# Patient Record
Sex: Male | Born: 1950 | Race: White | Hispanic: No | Smoking: Former smoker
Health system: Southern US, Community
[De-identification: ages and names within clinical notes are randomized; demographics above are authoritative.]

## PROBLEM LIST (undated history)

## (undated) DIAGNOSIS — M51369 Other intervertebral disc degeneration, lumbar region without mention of lumbar back pain or lower extremity pain: Secondary | ICD-10-CM

## (undated) DIAGNOSIS — M5030 Other cervical disc degeneration, unspecified cervical region: Secondary | ICD-10-CM

## (undated) DIAGNOSIS — IMO0001 Reserved for inherently not codable concepts without codable children: Secondary | ICD-10-CM

## (undated) DIAGNOSIS — M5136 Other intervertebral disc degeneration, lumbar region: Secondary | ICD-10-CM

## (undated) DIAGNOSIS — R51 Headache: Secondary | ICD-10-CM

## (undated) DIAGNOSIS — H548 Legal blindness, as defined in USA: Secondary | ICD-10-CM

## (undated) DIAGNOSIS — I739 Peripheral vascular disease, unspecified: Secondary | ICD-10-CM

## (undated) DIAGNOSIS — T8859XA Other complications of anesthesia, initial encounter: Secondary | ICD-10-CM

## (undated) DIAGNOSIS — G40909 Epilepsy, unspecified, not intractable, without status epilepticus: Secondary | ICD-10-CM

## (undated) DIAGNOSIS — R351 Nocturia: Secondary | ICD-10-CM

## (undated) DIAGNOSIS — R519 Headache, unspecified: Secondary | ICD-10-CM

## (undated) DIAGNOSIS — E119 Type 2 diabetes mellitus without complications: Secondary | ICD-10-CM

## (undated) DIAGNOSIS — R569 Unspecified convulsions: Secondary | ICD-10-CM

## (undated) DIAGNOSIS — I6529 Occlusion and stenosis of unspecified carotid artery: Secondary | ICD-10-CM

## (undated) DIAGNOSIS — R3915 Urgency of urination: Secondary | ICD-10-CM

## (undated) DIAGNOSIS — R531 Weakness: Secondary | ICD-10-CM

## (undated) DIAGNOSIS — I639 Cerebral infarction, unspecified: Secondary | ICD-10-CM

## (undated) DIAGNOSIS — T4145XA Adverse effect of unspecified anesthetic, initial encounter: Secondary | ICD-10-CM

## (undated) DIAGNOSIS — I10 Essential (primary) hypertension: Secondary | ICD-10-CM

## (undated) DIAGNOSIS — H409 Unspecified glaucoma: Secondary | ICD-10-CM

## (undated) DIAGNOSIS — K219 Gastro-esophageal reflux disease without esophagitis: Secondary | ICD-10-CM

## (undated) DIAGNOSIS — H269 Unspecified cataract: Secondary | ICD-10-CM

## (undated) HISTORY — DX: Occlusion and stenosis of unspecified carotid artery: I65.29

## (undated) HISTORY — PX: COLONOSCOPY: SHX174

## (undated) HISTORY — DX: Epilepsy, unspecified, not intractable, without status epilepticus: G40.909

## (undated) HISTORY — PX: EYE SURGERY: SHX253

---

## 2001-03-03 ENCOUNTER — Ambulatory Visit (HOSPITAL_COMMUNITY): Admission: RE | Admit: 2001-03-03 | Discharge: 2001-03-03 | Payer: Self-pay | Admitting: Ophthalmology

## 2001-05-19 ENCOUNTER — Ambulatory Visit (HOSPITAL_COMMUNITY): Admission: RE | Admit: 2001-05-19 | Discharge: 2001-05-19 | Payer: Self-pay | Admitting: Ophthalmology

## 2002-04-12 ENCOUNTER — Emergency Department (HOSPITAL_COMMUNITY): Admission: EM | Admit: 2002-04-12 | Discharge: 2002-04-12 | Payer: Self-pay | Admitting: Emergency Medicine

## 2003-01-18 ENCOUNTER — Encounter: Payer: Self-pay | Admitting: *Deleted

## 2003-01-18 ENCOUNTER — Emergency Department (HOSPITAL_COMMUNITY): Admission: EM | Admit: 2003-01-18 | Discharge: 2003-01-18 | Payer: Self-pay | Admitting: *Deleted

## 2006-08-08 ENCOUNTER — Emergency Department (HOSPITAL_COMMUNITY): Admission: EM | Admit: 2006-08-08 | Discharge: 2006-08-08 | Payer: Self-pay | Admitting: Emergency Medicine

## 2006-11-02 ENCOUNTER — Emergency Department (HOSPITAL_COMMUNITY): Admission: EM | Admit: 2006-11-02 | Discharge: 2006-11-02 | Payer: Self-pay | Admitting: Emergency Medicine

## 2007-08-22 ENCOUNTER — Emergency Department (HOSPITAL_COMMUNITY): Admission: EM | Admit: 2007-08-22 | Discharge: 2007-08-22 | Payer: Self-pay | Admitting: Emergency Medicine

## 2007-12-24 ENCOUNTER — Emergency Department (HOSPITAL_COMMUNITY): Admission: EM | Admit: 2007-12-24 | Discharge: 2007-12-24 | Payer: Self-pay | Admitting: Emergency Medicine

## 2007-12-27 ENCOUNTER — Emergency Department (HOSPITAL_COMMUNITY): Admission: EM | Admit: 2007-12-27 | Discharge: 2007-12-27 | Payer: Self-pay | Admitting: Emergency Medicine

## 2008-01-28 ENCOUNTER — Emergency Department (HOSPITAL_COMMUNITY): Admission: EM | Admit: 2008-01-28 | Discharge: 2008-01-28 | Payer: Self-pay | Admitting: Emergency Medicine

## 2011-09-03 ENCOUNTER — Encounter (HOSPITAL_COMMUNITY): Admission: RE | Admit: 2011-09-03 | Discharge: 2011-09-03 | Payer: Medicare Other | Source: Ambulatory Visit

## 2011-09-03 NOTE — Patient Instructions (Signed)
[address]  09/03/2011   Your procedure is scheduled on:  09/10/11  Report to [facility] at 06:15 AM.  Call this number if you have problems the morning of surgery: [phone]   Remember:   Do not eat food:After Midnight.  May have clear liquids:until Midnight .  Clear liquids include soda, tea, black coffee, apple or grape juice, broth.  Take these medicines the morning of surgery with A SIP OF WATER:    Do not wear jewelry, make-up or nail polish.  Do not wear lotions, powders, or perfumes. You may wear deodorant.  Do not shave 48 hours prior to surgery.  Do not bring valuables to the hospital.  Contacts, dentures or bridgework may not be worn into surgery.  Leave suitcase in the car. After surgery it may be brought to your room.  For patients admitted to the hospital, checkout time is 11:00 AM the day of discharge.   Patients discharged the day of surgery will not be allowed to drive home.  Name and phone number of your driver:   Special Instructions: N/A   Please read over the following fact sheets that you were given: Anesthesia Post-op Instructions and Care and Recovery After Surgery    Cataract A cataract is a clouding of the lens of the eye. It is most often related to aging. A cataract is not a "film" over the surface of the eye. The lens is inside the eye and changes size of the pupil. The lens can enlarge to let more light enter the eye in dark environments and contract the size of the pupil to let in bright light. The lens is the part of the eye that helps focus light on the retina. The retina is the eye's light-sensitive layer. It is in the back of the eye that sends visual signals to the brain. In a normal eye, light passes through the lens and gets focused on the retina. To help produce a sharp image, the lens must remain clear. When a lens becomes cloudy, vision is compromised by the degree and nature of the clouding. Certain cataracts make people more near-sighted as  they develop, others increase glare, and all reduce vision to some degree or another. A cataract that is so dense that it becomes milky Jamoni Hewes and a Chenay Nesmith opacity can be seen through the pupil. When the Kobi Aller color is seen, it is called a "mature" or "hyper-mature cataract." Such cataracts cause total blindness in the affected eye. The cataract must be removed to prevent damage to the eye itself. Some types of cataracts can cause a secondary disease of the eye, such as certain types of glaucoma. In the early stages, better lighting and eyeglasses may lessen vision problems caused by cataracts. At a certain point, surgery may be needed to improve vision. CAUSES   Aging. However, cataracts may occur at any age, even in newborns.   Certain drugs.   Trauma to the eye.   Certain diseases (such as diabetes).   Inherited or acquired medical syndromes.  SYMPTOMS   Gradual, progressive drop in vision in the affected eye. Cataracts may develop at different rates in each eye. Cataracts may even be in just one eye with the other unaffected.   Cataracts due to trauma may develop quickly, sometimes over a matter or days or even hours. The result is severe and rapid visual loss.  DIAGNOSIS  To detect a cataract, an eye doctor examines the lens. A well developed cataract can be  diagnosed without dilating the pupil. Early cataracts and others of a specific nature are best diagnosed with an exam of the eyes with the pupils dilated by drops. TREATMENT   For an early cataract, vision may improve by using different eyeglasses or stronger lighting.   If the above measures do not help, surgery is the only effective treatment. This treatment removes the cloudy lens and replaces it with a substitute lens (Intraocular lens, or IOL). Newly developed IOL technology allows the implanted lens to improve vision both at a distance and up close. Discuss with your eye surgeon about the possibility of still needing glasses.  Also discuss how visual coordination between both eyes will be affected.  A cataract needs to be removed only when vision loss interferes with your everyday activities such as driving, reading or watching TV. You and your eye doctor can make that decision together. In most cases, waiting until you are ready to have cataract surgery will not harm your eye. If you have cataracts in both eyes, only one should be removed at a time. This allows the operated eye to heal and be out of danger from serious problems (such as infection or poor wound healing) before having the other eye undergo surgery.  Sometimes, a cataract should be removed even if it does not cause problems with your vision. For example, a cataract should be removed if it prevents examination or treatment of another eye problem. Just as you cannot see out of the affected eye well, your doctor cannot see into your eye well through a cataract. The vast majority of people who have cataract surgery have better vision afterward. CATARACT REMOVAL There are two primary ways to remove a cataract. Your doctor can explain the differences and help determine which is best for you:  Phacoemulsification (small incision cataract surgery). This involves making a small cut (incision) on the edge of the clear, dome-shaped surface that covers the front of the eye (the cornea). An injection behind the eye or eye drops are given to make this a painless procedure. The doctor then inserts a tiny probe into the eye. This device emits ultrasound waves that soften and break up the cloudy center of the lens so it can be removed by suction. Most cataract surgery is done this way. The cuts are usually so small and performed in such a manner that often no sutures are needed to keep it closed.   Extracapsular surgery. Your doctor makes a slightly longer incision on the side of the cornea. The doctor removes the hard center of the lens. The remainder of the lens is then removed  by suction. In some cases, extremely fine sutures are needed which the doctor may, or may not remove in the office after the surgery.  When an IOL is implanted, it needs no care. It becomes a permanent part of your eye and cannot be seen or felt.  Some people cannot have an IOL. They may have problems during surgery, or maybe they have another eye disease. For these people, a soft contact lens may be suggested. If an IOL or contact lens cannot be used, very powerful and thick glasses are required after surgery. Since vision is very different through such thick glasses, it is important to have your doctor discuss the impact on your vision after any cataract surgery where there is no plan to implant an IOL. The normal lens of the eye is covered by a clear capsule. Both phacoemulsification and extracapsular surgery require that  the back surface of this lens capsule be left in place. This helps support IOLs and prevents the IOL from dislocating and falling back into the deeper interior of the eye. Right after surgery, and often permanently this "posterior capsule" remains clear. In some cases however, it can become cloudy, presenting the same type of visual compromise that the original cataract did since light is again obstructed as it passes through the clear IOL. This condition is often referred to as an "after-cataract." Fortunately, after-cataracts are easily treated using a painless and very fast laser treatment that is performed without anesthesia or incisions. It is done in a matter of minutes in an outpatient environment. Visual improvement is often immediate.  HOME CARE INSTRUCTIONS   Your surgeon will discuss pre and post operative care with you prior to surgery. The majority of people are able to do almost all normal activities right away. Although, it is often advised to avoid strenuous activity for a period of time.   Postoperative drops and careful avoidance of infection will be needed. Many  surgeons suggest the use of a protective shield during the first few days after surgery.   There is a very small incidence of complication from modern cataract surgery, but it can happen. Infection that spreads to the inside of the eye (endophthalmitis) can result in total visual loss and even loss of the eye itself. In extremely rare instances, the inflammation of endophthalmitis can spread to both eyes (sympathetic ophthalmia). Appropriate post-operative care under the close observation of your surgeon is essential to a successful outcome.  SEEK IMMEDIATE MEDICAL CARE IF:   You have any sudden drop of vision in the operated eye.   You have pain in the operated eye.   You see a large number of floating dots in the field of vision in the operated eye.   You see flashing lights, or if a portion of your side vision in any direction appears black (like a curtain being drawn into your field of vision) in the operated eye.  Document Released: 07/01/2005 Document Revised: 03/13/2011 Document Reviewed: 08/17/2007 [facility] Patient Information 2012 [place], [place].   PATIENT INSTRUCTIONS POST-ANESTHESIA  IMMEDIATELY FOLLOWING SURGERY:  Do not drive or operate machinery for the first twenty four hours after surgery.  Do not make any important decisions for twenty four hours after surgery or while taking narcotic pain medications or sedatives.  If you develop intractable nausea and vomiting or a severe headache please notify your doctor immediately.  FOLLOW-UP:  Please make an appointment with your surgeon as instructed. You do not need to follow up with anesthesia unless specifically instructed to do so.  WOUND CARE INSTRUCTIONS (if applicable):  Keep a dry clean dressing on the anesthesia/puncture wound site if there is drainage.  Once the wound has quit draining you may leave it open to air.  Generally you should leave the bandage intact for twenty four hours unless there is drainage.  If the epidural  site drains for more than 36-48 hours please call the anesthesia department.  QUESTIONS?:  Please feel free to call your physician or the hospital operator if you have any questions, and they will be happy to assist you.     [facility] Anesthesia Department [address] [place] [phone]

## 2011-09-10 ENCOUNTER — Ambulatory Visit (HOSPITAL_COMMUNITY): Admission: RE | Admit: 2011-09-10 | Payer: Medicare Other | Source: Ambulatory Visit | Admitting: Ophthalmology

## 2011-09-10 ENCOUNTER — Encounter (HOSPITAL_COMMUNITY): Admission: RE | Payer: Self-pay | Source: Ambulatory Visit

## 2011-09-10 SURGERY — PHACOEMULSIFICATION, CATARACT, WITH IOL INSERTION
Anesthesia: Monitor Anesthesia Care | Laterality: Right

## 2014-08-17 ENCOUNTER — Emergency Department (HOSPITAL_COMMUNITY): Payer: Medicare Other

## 2014-08-17 ENCOUNTER — Encounter (HOSPITAL_COMMUNITY): Payer: Self-pay | Admitting: Emergency Medicine

## 2014-08-17 ENCOUNTER — Emergency Department (HOSPITAL_COMMUNITY)
Admission: EM | Admit: 2014-08-17 | Discharge: 2014-08-18 | Disposition: A | Discharge status: Home / Self Care | Payer: Medicare Other | Attending: Emergency Medicine | Admitting: Emergency Medicine

## 2014-08-17 DIAGNOSIS — H409 Unspecified glaucoma: Secondary | ICD-10-CM | POA: Diagnosis not present

## 2014-08-17 DIAGNOSIS — Y92009 Unspecified place in unspecified non-institutional (private) residence as the place of occurrence of the external cause: Secondary | ICD-10-CM | POA: Insufficient documentation

## 2014-08-17 DIAGNOSIS — Z720 Tobacco use: Secondary | ICD-10-CM | POA: Diagnosis not present

## 2014-08-17 DIAGNOSIS — Z8673 Personal history of transient ischemic attack (TIA), and cerebral infarction without residual deficits: Secondary | ICD-10-CM | POA: Insufficient documentation

## 2014-08-17 DIAGNOSIS — Y288XXA Contact with other sharp object, undetermined intent, initial encounter: Secondary | ICD-10-CM | POA: Diagnosis not present

## 2014-08-17 DIAGNOSIS — Z7982 Long term (current) use of aspirin: Secondary | ICD-10-CM | POA: Insufficient documentation

## 2014-08-17 DIAGNOSIS — Z23 Encounter for immunization: Secondary | ICD-10-CM | POA: Insufficient documentation

## 2014-08-17 DIAGNOSIS — Y9389 Activity, other specified: Secondary | ICD-10-CM | POA: Diagnosis not present

## 2014-08-17 DIAGNOSIS — S61215A Laceration without foreign body of left ring finger without damage to nail, initial encounter: Secondary | ICD-10-CM | POA: Insufficient documentation

## 2014-08-17 DIAGNOSIS — R569 Unspecified convulsions: Secondary | ICD-10-CM | POA: Insufficient documentation

## 2014-08-17 DIAGNOSIS — E119 Type 2 diabetes mellitus without complications: Secondary | ICD-10-CM | POA: Insufficient documentation

## 2014-08-17 DIAGNOSIS — I10 Essential (primary) hypertension: Secondary | ICD-10-CM | POA: Insufficient documentation

## 2014-08-17 DIAGNOSIS — R51 Headache: Secondary | ICD-10-CM | POA: Insufficient documentation

## 2014-08-17 DIAGNOSIS — Y998 Other external cause status: Secondary | ICD-10-CM | POA: Insufficient documentation

## 2014-08-17 HISTORY — DX: Type 2 diabetes mellitus without complications: E11.9

## 2014-08-17 HISTORY — DX: Unspecified convulsions: R56.9

## 2014-08-17 HISTORY — DX: Essential (primary) hypertension: I10

## 2014-08-17 MED ORDER — TETANUS-DIPHTH-ACELL PERTUSSIS 5-2.5-18.5 LF-MCG/0.5 IM SUSP
0.5000 mL | Freq: Once | INTRAMUSCULAR | Status: AC
  Administered 2014-08-17: 0.5 mL via INTRAMUSCULAR
  Filled 2014-08-17: qty 0.5

## 2014-08-17 MED ORDER — LEVETIRACETAM 500 MG PO TABS
500.0000 mg | ORAL_TABLET | Freq: Once | ORAL | Status: AC
  Administered 2014-08-17: 500 mg via ORAL
  Filled 2014-08-17: qty 1

## 2014-08-17 MED ORDER — LIDOCAINE HCL (PF) 1 % IJ SOLN
INTRAMUSCULAR | Status: AC
  Administered 2014-08-17: 23:00:00
  Filled 2014-08-17: qty 5

## 2014-08-17 NOTE — ED Provider Notes (Signed)
CSN: [id number]     Arrival date & time 08/17/14  2054 History  This chart was scribed for Gerhard Munchobert Yosselin Zoeller, MD by Tonye RoyaltyJoshua Chen, ED Scribe. This patient was seen in room APA19/APA19 and the patient's care was started at 9:26 PM.    Chief Complaint  Patient presents with  . Seizures   The history is provided by the patient and a relative. No language interpreter was used.    HPI Comments: Ricky Sherman is a 64 y.o. male with history of seizures for the past 2.5 years who presents to the Emergency Department complaining of seizure just PTA, witnessed by his daughter. He states his PCP decided not to prescribe him any preventative medications. Per daughter, the seizures are not always the same. She states today his head was shaking with his seizure, lasting a few seconds. He states he gets clammy before his seizures and states he had that today. He reports a post ictal phase, daughter notes it was longer than usual today. He states he feels better now except a headache, which he states he typically has after seizures. He denies tongue biting. He denies falling or striking his head. He notes a laceration to his left 4th finger that occurred earlier today, he requests tetanus shot. He reports history of DM, HTN, glaucoma, CVA, and degenerative disc disease. He states he is out of his HTN medication and has been having difficulty finding a PCP who takes Medicare/Medicaid.  Past Medical History  Diagnosis Date  . Diabetes mellitus without complication   . Hypertension   . Seizures    Past Surgical History  Procedure Laterality Date  . Eye surgery     No family history on file. History  Substance Use Topics  . Smoking status: Current Every Day Smoker    Types: Cigarettes  . Smokeless tobacco: Not on file  . Alcohol Use: Yes    Review of Systems  Constitutional:       Per HPI, otherwise negative  HENT:       Per HPI, otherwise negative  Respiratory:       Per HPI, otherwise negative   Cardiovascular:       Per HPI, otherwise negative  Gastrointestinal: Negative for vomiting.  Endocrine:       Negative aside from HPI  Genitourinary:       Neg aside from HPI   Musculoskeletal:       Per HPI, otherwise negative  Skin: Positive for wound.  Neurological: Positive for seizures and headaches. Negative for syncope.      Allergies  Review of patient's allergies indicates no known allergies.  Home Medications   Prior to Admission medications   Medication Sig Start Date End Date Taking? Authorizing Provider  aspirin EC 325 MG tablet Take 325 mg by mouth every morning.   Yes Historical Provider, MD  Fish Oil-Cholecalciferol (FISH OIL + D3 PO) Take 1 capsule by mouth 2 (two) times daily.   Yes Historical Provider, MD  UNKNOWN TO PATIENT Take 1 tablet by mouth daily. BP medication-name is unknown   Yes Historical Provider, MD   BP 114/72 mmHg  Pulse 65  Temp(Src) 97.4 F (36.3 C) (Oral)  Resp 16  Ht 5\' 7"  (1.702 m)  Wt 175 lb (79.379 kg)  BMI 27.40 kg/m2  SpO2 93% Physical Exam  Constitutional: He is oriented to person, place, and time. He appears well-developed. No distress.  HENT:  Head: Normocephalic and atraumatic.  Eyes: Conjunctivae and EOM are normal.  Cardiovascular: Normal rate and regular rhythm.   Pulmonary/Chest: Effort normal and breath sounds normal. No stridor. No respiratory distress. He has no wheezes. He has no rales.  Abdominal: He exhibits no distension.  Musculoskeletal: He exhibits no edema.  Neurological: He is alert and oriented to person, place, and time. No cranial nerve deficit. He exhibits normal muscle tone. Coordination normal.  Some atrophy to legs  Skin: Skin is warm and dry.  Psychiatric: He has a normal mood and affect.  Nursing note and vitals reviewed.   ED Course  Procedures (including critical care time)  DIAGNOSTIC STUDIES: Oxygen Saturation is 95% on room air, normal by my interpretation.    COORDINATION OF  CARE: 9:31 PM Discussed treatment plan with patient at beside, the patient agrees with the plan and has no further questions at this time.   Labs Review Labs Reviewed - No data to display  Imaging Review Dg Finger Ring Left  08/17/2014   CLINICAL DATA:  Laceration to the fourth digit left hand. Initial encounter.  EXAM: LEFT RING FINGER 2+V  COMPARISON:  None.  FINDINGS: There is a laceration through the mid fourth digit along the radial surface. No acute fracture, dislocation, or opaque foreign body.  IMPRESSION: Fourth digit laceration without foreign body or fracture.   Electronically Signed   By: Tiburcio Pea M.D.   On: 08/17/2014 22:36   LACERATION REPAIR Performed by: Gerhard Munch Authorized by: Gerhard Munch Consent: Verbal consent obtained. Risks and benefits: risks, benefits and alternatives were discussed Consent given by: patient Patient identity confirmed: provided demographic data Prepped and Draped in normal sterile fashion Wound explored  Laceration Location: L ring finger  Laceration Length: 3.5cm  No Foreign Bodies seen or palpated  Anesthesia: local infiltration  Local anesthetic: lidocaine 1% no epinephrine  Anesthetic total: 5 ml  Irrigation method: syringe Amount of cleaning: standard  Skin closure: 5-0 sutures  Number of sutures: 2  Technique: close  Patient tolerance: Patient tolerated the procedure well with no immediate complications.  11:58 PM Patient and family aware of all results, need follow-up with neurology, the need for medication use pending urology follow-up.   MDM   Patient presents after typical witnessed seizure.  Patient has had seizures for years, no recent medication use. Patient recovered well, had no evidence for distress or other acute new pathology.  Patient had finger laceration repaired without complication.  I personally performed the services described in this documentation, which was scribed in my  presence. The recorded information has been reviewed and is accurate.     Gerhard Munch, MD 08/17/14 [phone]

## 2014-08-17 NOTE — ED Notes (Signed)
Pt had witnessed seizure at home and was incontinent at time.

## 2014-08-17 NOTE — ED Notes (Signed)
Per ems pt has cut and needs tetanus injection.

## 2014-08-17 NOTE — ED Notes (Signed)
Laceration noted to left ring finger. Pt. Reports cutting it on old fridge today. No active bleeding at this time.

## 2014-08-18 MED ORDER — LEVETIRACETAM 500 MG PO TABS: 500.0000 mg | ORAL_TABLET | Freq: Two times a day (BID) | ORAL | Status: AC

## 2014-08-18 NOTE — Discharge Instructions (Signed)
As discussed, it is important that you follow-up with your physicians for appropriate ongoing evaluation of both your laceration in your seizures.  Return here for concerning changes in your condition.    Seizure, Adult A seizure is abnormal electrical activity in the brain. Seizures usually last from 30 seconds to 2 minutes. There are various types of seizures. Before a seizure, you may have a warning sensation (aura) that a seizure is about to occur. An aura may include the following symptoms:   Fear or anxiety.  Nausea.  Feeling like the room is spinning (vertigo).  Vision changes, such as seeing flashing lights or spots. Common symptoms during a seizure include:  A change in attention or behavior (altered mental status).  Convulsions with rhythmic jerking movements.  Drooling.  Rapid eye movements.  Grunting.  Loss of bladder and bowel control.  Bitter taste in the mouth.  Tongue biting. After a seizure, you may feel confused and sleepy. You may also have an injury resulting from convulsions during the seizure. HOME CARE INSTRUCTIONS   If you are given medicines, take them exactly as prescribed by your health care provider.  Keep all follow-up appointments as directed by your health care provider.  Do not swim or drive or engage in risky activity during which a seizure could cause further injury to you or others until your health care provider says it is OK.  Get adequate rest.  Teach friends and family what to do if you have a seizure. They should:  Lay you on the ground to prevent a fall.  Put a cushion under your head.  Loosen any tight clothing around your neck.  Turn you on your side. If vomiting occurs, this helps keep your airway clear.  Stay with you until you recover.  Know whether or not you need emergency care. SEEK IMMEDIATE MEDICAL CARE IF:  The seizure lasts longer than 5 minutes.  The seizure is severe or you do not wake up immediately  after the seizure.  You have an altered mental status after the seizure.  You are having more frequent or worsening seizures. Someone should drive you to the emergency department or call local emergency services (911 in [place] MAKE SURE YOU:  Understand these instructions.  Will watch your condition.  Will get help right away if you are not doing well or get worse. Document Released: 06/28/2000 Document Revised: 04/21/2013 Document Reviewed: 02/10/2013 [facility] Patient Information 2015 [place]. This information is not intended to replace advice given to you by your health care provider. Make sure you discuss any questions you have with your health care provider.

## 2014-12-01 ENCOUNTER — Other Ambulatory Visit (HOSPITAL_COMMUNITY): Payer: Self-pay | Admitting: Family Medicine

## 2014-12-01 DIAGNOSIS — R569 Unspecified convulsions: Secondary | ICD-10-CM

## 2014-12-13 ENCOUNTER — Ambulatory Visit (HOSPITAL_COMMUNITY)
Admission: RE | Admit: 2014-12-13 | Discharge: 2014-12-13 | Disposition: A | Discharge status: Home / Self Care | Payer: Medicare Other | Source: Ambulatory Visit | Attending: Family Medicine | Admitting: Family Medicine

## 2014-12-13 DIAGNOSIS — R531 Weakness: Secondary | ICD-10-CM | POA: Diagnosis not present

## 2014-12-13 DIAGNOSIS — R51 Headache: Secondary | ICD-10-CM | POA: Diagnosis not present

## 2014-12-13 DIAGNOSIS — R569 Unspecified convulsions: Secondary | ICD-10-CM

## 2014-12-29 ENCOUNTER — Encounter (HOSPITAL_COMMUNITY): Payer: Self-pay | Admitting: Emergency Medicine

## 2014-12-29 ENCOUNTER — Emergency Department (HOSPITAL_COMMUNITY)
Admission: EM | Admit: 2014-12-29 | Discharge: 2014-12-29 | Disposition: A | Discharge status: Home / Self Care | Payer: Medicare Other | Attending: Emergency Medicine | Admitting: Emergency Medicine

## 2014-12-29 ENCOUNTER — Emergency Department (HOSPITAL_COMMUNITY): Payer: Medicare Other

## 2014-12-29 DIAGNOSIS — G40909 Epilepsy, unspecified, not intractable, without status epilepticus: Secondary | ICD-10-CM | POA: Insufficient documentation

## 2014-12-29 DIAGNOSIS — I10 Essential (primary) hypertension: Secondary | ICD-10-CM | POA: Insufficient documentation

## 2014-12-29 DIAGNOSIS — Z8673 Personal history of transient ischemic attack (TIA), and cerebral infarction without residual deficits: Secondary | ICD-10-CM | POA: Diagnosis not present

## 2014-12-29 DIAGNOSIS — Z7982 Long term (current) use of aspirin: Secondary | ICD-10-CM | POA: Diagnosis not present

## 2014-12-29 DIAGNOSIS — Z720 Tobacco use: Secondary | ICD-10-CM | POA: Insufficient documentation

## 2014-12-29 DIAGNOSIS — R569 Unspecified convulsions: Secondary | ICD-10-CM

## 2014-12-29 DIAGNOSIS — Z79899 Other long term (current) drug therapy: Secondary | ICD-10-CM | POA: Insufficient documentation

## 2014-12-29 DIAGNOSIS — Z8739 Personal history of other diseases of the musculoskeletal system and connective tissue: Secondary | ICD-10-CM | POA: Insufficient documentation

## 2014-12-29 DIAGNOSIS — E119 Type 2 diabetes mellitus without complications: Secondary | ICD-10-CM | POA: Insufficient documentation

## 2014-12-29 HISTORY — DX: Other cervical disc degeneration, unspecified cervical region: M50.30

## 2014-12-29 HISTORY — DX: Cerebral infarction, unspecified: I63.9

## 2014-12-29 HISTORY — DX: Headache, unspecified: R51.9

## 2014-12-29 HISTORY — DX: Weakness: R53.1

## 2014-12-29 HISTORY — DX: Other intervertebral disc degeneration, lumbar region without mention of lumbar back pain or lower extremity pain: M51.369

## 2014-12-29 HISTORY — DX: Headache: R51

## 2014-12-29 HISTORY — DX: Other intervertebral disc degeneration, lumbar region: M51.36

## 2014-12-29 LAB — I-STAT CHEM 8, ED
BUN: 6 mg/dL (ref 6–20)
CHLORIDE: 103 mmol/L (ref 101–111)
Calcium, Ion: 1.12 mmol/L — ABNORMAL LOW (ref 1.13–1.30)
Creatinine, Ser: 0.8 mg/dL (ref 0.61–1.24)
GLUCOSE: 107 mg/dL — AB (ref 65–99)
HEMATOCRIT: 46 % (ref 39.0–52.0)
HEMOGLOBIN: 15.6 g/dL (ref 13.0–17.0)
POTASSIUM: 4.2 mmol/L (ref 3.5–5.1)
Sodium: 140 mmol/L (ref 135–145)
TCO2: 23 mmol/L (ref 0–100)

## 2014-12-29 MED ORDER — LEVETIRACETAM 500 MG PO TABS
500.0000 mg | ORAL_TABLET | Freq: Once | ORAL | Status: AC
  Administered 2014-12-29: 500 mg via ORAL
  Filled 2014-12-29: qty 1

## 2014-12-29 NOTE — ED Notes (Signed)
Seizure at 1230, Witness seizure by brother.  Denies hitting head.  Denies any pain at this time.  On seizure medication but not sure of the name of medication, did take first dose at 6am and next dose not due until 1600.  Last seizure in January.

## 2014-12-29 NOTE — ED Notes (Signed)
MD at bedside. 

## 2014-12-29 NOTE — ED Notes (Signed)
Pad placed on rails.

## 2014-12-29 NOTE — ED Provider Notes (Signed)
CSN: [id number]     Arrival date & time 12/29/14  1339 History   First MD Initiated Contact with Patient 12/29/14 1356     Chief Complaint  Patient presents with  . Seizures      HPI  Pt was seen at 1405. Per EMS, pt's family and pt, c/o sudden onset and resolution of one episode of "seizure" that occurred PTA. Pt states he "got clammy" and then "woke up on the floor." Pt states he woke up "feeling weak" and "having wet on myself." Pt's family states pt "just shook a little but was mostly stiff" during this episode. Symptoms lasted only a few minutes before spontaneously resolving. Pt states he "just feels tired now." Pt describes all of these symptoms as per his usual seizure pattern for the past 2 to 3 years. Pt's family states they helped pt to the floor, he did not fall. Pt states his PMD is managing his seizure medicine. Denies headache, no neck or back pain, no CP/palpitations, no abd pain, no N/V/D, no new focal motor weakness or tingling/numbness in extremities.       Past Medical History  Diagnosis Date  . Diabetes mellitus without complication   . Hypertension   . Seizures   . DDD (degenerative disc disease), lumbar   . DDD (degenerative disc disease), cervical   . Headache   . Weakness   . Stroke     right sided weakness   Past Surgical History  Procedure Laterality Date  . Eye surgery      History  Substance Use Topics  . Smoking status: Current Every Day Smoker    Types: Cigarettes  . Smokeless tobacco: Not on file  . Alcohol Use: Yes    Review of Systems ROS: Statement: All systems negative except as marked or noted in the HPI; Constitutional: Negative for fever and chills. ; ; Eyes: Negative for eye pain, redness and discharge. ; ; ENMT: Negative for ear pain, hoarseness, nasal congestion, sinus pressure and sore throat. ; ; Cardiovascular: Negative for chest pain, palpitations, diaphoresis, dyspnea and peripheral edema. ; ; Respiratory: Negative for cough,  wheezing and stridor. ; ; Gastrointestinal: Negative for nausea, vomiting, diarrhea, abdominal pain, blood in stool, hematemesis, jaundice and rectal bleeding. . ; ; Genitourinary: Negative for dysuria, flank pain and hematuria. ; ; Musculoskeletal: Negative for back pain and neck pain. Negative for swelling and trauma.; ; Skin: Negative for pruritus, rash, abrasions, blisters, bruising and skin lesion.; ; Neuro: Negative for headache, lightheadedness and neck stiffness. Negative for extremity weakness, paresthesias, +seizure.     Allergies  Review of patient's allergies indicates no known allergies.  Home Medications   Prior to Admission medications   Medication Sig Start Date End Date Taking? Authorizing Provider  aspirin EC 325 MG tablet Take 325 mg by mouth every morning.    Historical Provider, MD  Fish Oil-Cholecalciferol (FISH OIL + D3 PO) Take 1 capsule by mouth 2 (two) times daily.    Historical Provider, MD  levETIRAcetam (KEPPRA) 500 MG tablet Take 1 tablet (500 mg total) by mouth 2 (two) times daily. 08/18/14   Gerhard Munch, MD  UNKNOWN TO PATIENT Take 1 tablet by mouth daily. BP medication-name is unknown    Historical Provider, MD   BP 135/75 mmHg  Pulse 69  Temp(Src) 97.5 F (36.4 C) (Oral)  Resp 18  Ht  (1.702 m)  Wt 154 lb (69.854 kg)  BMI 24.11 kg/m2  SpO2 98% Physical Exam  1410: Physical examination:  Nursing notes reviewed; Vital signs and O2 SAT reviewed;  Constitutional: Well developed, Well nourished, Well hydrated, In no acute distress; Head:  Normocephalic, atraumatic; Eyes: EOMI, PERRL, No scleral icterus; ENMT: Mouth and pharynx normal, Mucous membranes moist; Neck: Supple, Full range of motion, No lymphadenopathy; Cardiovascular: Regular rate and rhythm, No gallop; Respiratory: Breath sounds clear & equal bilaterally, No wheezes.  Speaking full sentences with ease, Normal respiratory effort/excursion; Chest: Nontender, Movement normal; Abdomen: Soft,  Nontender, Nondistended, Normal bowel sounds; Genitourinary: No CVA tenderness; Extremities: Pulses normal, No tenderness, No edema, No calf edema or asymmetry.; Neuro: AA&Ox3, Major CN grossly intact.  Speech clear. No facial droop. +mild RUE and RLE weakness per hx, otherwise no gross focal motor deficits in extremities.; Skin: Color normal, Warm, Dry.   ED Course  Procedures     EKG Interpretation   Date/Time:  Thursday December 29 2014 13:52:58 EDT Ventricular Rate:  65 PR Interval:  145 QRS Duration: 90 QT Interval:  423 QTC Calculation: 440 R Axis:     Text Interpretation:  Sinus rhythm Anteroseptal infarct, old No old  tracing to compare Confirmed by MILLER  MD, BRIAN ([id number]) on 12/29/2014  1:57:58 PM      MDM  MDM Reviewed: previous chart, nursing note and vitals Reviewed previous: MRI Interpretation: labs and CT scan     Mr Brain Wo Contrast 12/13/2014   CLINICAL DATA:  64 year old male with seizures x4 months. Weakness and headache. No known injury. Initial encounter.  EXAM: MRI HEAD WITHOUT CONTRAST  TECHNIQUE: Multiplanar, multiecho pulse sequences of the brain and surrounding structures were obtained without intravenous contrast.  COMPARISON:  None.  FINDINGS: Cerebral volume is within normal limits for age. No restricted diffusion to suggest acute infarction. No midline shift, mass effect, evidence of mass lesion, ventriculomegaly, extra-axial collection or acute intracranial hemorrhage. Cervicomedullary junction and pituitary are within normal limits. Negative visualized cervical spine. Major intracranial vascular flow voids are within normal limits.  Chronic lacunar infarct of the left lentiform nuclei with mild hemosiderin (series 6, image 13). Elsewhere mild for age nonspecific scattered cerebral white matter T2 and FLAIR hyperintensity. No cortical encephalomalacia or other chronic blood products are identified in the brain. Mesial temporal lobes including the bilateral  hippocampal formations appear within normal limits.  Trace inferior mastoid fluid appears inconsequential. Visible internal auditory structures appear normal. Trace paranasal sinus mucosal thickening. Postoperative changes to the left globe, other orbits soft tissues appear normal. Normal bone marrow signal. Visualized scalp soft tissues are within normal limits.  IMPRESSION: 1.  No acute intracranial abnormality. 2. Mild for age chronic small vessel ischemia.   Electronically Signed   By: Odessa Fleming M.D.   On: 12/13/2014 13:38   Results for orders placed or performed during the hospital encounter of 12/29/14  I-stat Chem 8, ED  Result Value Ref Range   Sodium 140 135 - 145 mmol/L   Potassium 4.2 3.5 - 5.1 mmol/L   Chloride 103 101 - 111 mmol/L   BUN 6 6 - 20 mg/dL   Creatinine, Ser 1.91 0.61 - 1.24 mg/dL   Glucose, Bld 478 (H) 65 - 99 mg/dL   Calcium, Ion 2.95 (L) 1.13 - 1.30 mmol/L   TCO2 23 0 - 100 mmol/L   Hemoglobin 15.6 13.0 - 17.0 g/dL   HCT 62.1 30.8 - 65.7 %   Ct Head Wo Contrast 12/29/2014   CLINICAL DATA:  Witnessed seizure at 1230 hours ; history diabetes, hypertension, seizures,  stroke, smoking  EXAM: CT HEAD WITHOUT CONTRAST  TECHNIQUE: Contiguous axial images were obtained from the base of the skull through the vertex without intravenous contrast.  COMPARISON:  None; correlation MR head 12/13/2014  FINDINGS: Mild generalized atrophy.  Normal ventricular morphology.  No midline shift or mass effect.  Old lacunar infarct LEFT basal ganglia.  No intracranial hemorrhage, mass lesion or evidence acute infarction.  No extra-axial fluid collections.  Atherosclerotic calcifications at skullbase.  Bones and sinuses unremarkable.  IMPRESSION: Atrophy with old lacunar infarct at LEFT basal ganglia.  No acute intracranial abnormalities.   Electronically Signed   By: Ulyses Southward M.D.   On: 12/29/2014 15:23    1545:  Pt had recent MRI brain (negative). Workup today reassuring. Pt states he feels  back to baseline and wants to go home now and family wants to take him home now. Dose of keppra given while in the ED. Dx and testing d/w pt and family.  Questions answered.  Verb understanding, agreeable to d/c home with outpt f/u.   Samuel Jester, DO 12/31/14 1447

## 2014-12-29 NOTE — Discharge Instructions (Signed)
°Emergency Department Resource Guide °1) Find a Doctor and Pay Out of Pocket °Although you won't have to find out who is covered by your insurance plan, it is a good idea to ask around and get recommendations. You will then need to call the office and see if the doctor you have chosen will accept you as a new patient and what types of options they offer for patients who are self-pay. Some doctors offer discounts or will set up payment plans for their patients who do not have insurance, but you will need to ask so you aren't surprised when you get to your appointment. ° °2) Contact Your Local Health Department °Not all health departments have doctors that can see patients for sick visits, but many do, so it is worth a call to see if yours does. If you don't know where your local health department is, you can check in your phone book. The CDC also has a tool to help you locate your state's health department, and many state websites also have listings of all of their local health departments. ° °3) Find a Walk-in Clinic °If your illness is not likely to be very severe or complicated, you may want to try a walk in clinic. These are popping up all over the country in pharmacies, drugstores, and shopping centers. They're usually staffed by nurse practitioners or physician assistants that have been trained to treat common illnesses and complaints. They're usually fairly quick and inexpensive. However, if you have serious medical issues or chronic medical problems, these are probably not your best option. ° °No Primary Care Doctor: °- Call [facility] at  832-8000 - they can help you locate a primary care doctor that  accepts your insurance, provides certain services, etc. °- Physician Referral Service- 1-800-533-3463 ° °Chronic Pain Problems: °Organization         Address  Phone   Notes  °[facility]  ([phone]) [phone] Patients need to be referred by their primary care doctor.  ° °Medication  Assistance: °Organization         Address  Phone   Notes  °[place] [facility] [address] °[place], [place] [address] (336) 641-8030 --Must be a resident of [place] °-- Must have NO insurance coverage whatsoever (no Medicaid/ Medicare, etc.) °-- The pt. MUST have a primary care doctor that directs their care regularly and follows them in the community °  °[facility]  (866) 331-1348   °[employer]  (888) 892-1162   ° °Agencies that provide inexpensive medical care: °Organization         Address  Phone   Notes  °[facility]  (336) 832-8035   °[facility]    (336) 832-7272   °[facility] [address] °[place], [place] [address] (336) 832-4777   °[facility] [address], °[place] (336) 271-4999   °[facility]    (336) 373-0678   °[facility]    (336) 272-1050   °[facility] ° [address], [place] Phone:  (336) 832-4444, Fax:  (336) 832-4440 Hours of Operation:  9 am - 6 pm, M-F.  Also accepts Medicaid/Medicare and self-pay.  °[facility] ° [address], [place] Phone: (336) [phone], Fax: (336) [phone]. Hours of Operation:  8:30 am - 5:30 pm, M-F.  Also accepts Medicaid and self-pay.  °[facility] [place] [address], [place] Phone: (336) 878-6027   °[facility] [address], [place], [place] (336)723-1848, Ext. 123 Mondays & Thursdays: 7-9 AM.  First 15 patients are seen on a first come, first serve basis. °  ° °Medicaid-accepting [place] Providers: ° °Organization         Address  Phone   Notes  °[facility] [address], [place] (336) 641-2100 Also accepts self-pay patients.  °[facility] [address], [place] ° (336) 856-9996   °[facility] [address], [place]  (336) 288-8857   °[facility] [address] [place] [address], [place] (336) 299-7000   °Veita Bland [address], [place]  ° (336) 373-1557 Only accepts [place] Access Medicaid patients after they have their name applied to their card.  ° °Self-Pay (no insurance) in [place]: ° °Organization         Address  Phone   Notes  °Sickle Cell Patients, [facility] [address], [place] (336) 832-1970   °[facility] [address], [place] (336) 832-4400   °[facility] ° [address], [place] (336) 992-4800   °[facility]/Dr. Osei-Bonsu ° [address] [place] [address], [place] or [address], [place] (336) [phone] Phone number for both [place] and [place] locations is the same.  °[facility] [address], [place] (336) 299-0000   °[facility] [address] [place] [address], [place] or [address] (336) 852-7530 °(336) 878-2260   °[facility] [address], [place] (336) 350-1642, phone; (336) 294-5005, fax Sees patients 1st and 3rd Saturday of every month.  Must not qualify for public or private insurance (i.e. Medicaid, Medicare, [place] Health Choice, Veterans' Benefits) • Household income should be no more than 200% of the poverty level •The clinic cannot treat you if you are pregnant or think you are pregnant • Sexually transmitted diseases are not treated at the clinic.  ° ° °Dental Care: °Organization         Address  Phone  Notes  °[place] [facility] [address], [place] (336) 641-6152 Accepts children up to age 21 who are enrolled in Medicaid or [place] Health Choice; pregnant women with a Medicaid card; and children who have applied for Medicaid or [place] Health Choice, but were declined, whose parents can pay a reduced fee at time of service.  °[place]  [facility] [place]  [address], [place] (336) 641-7733 Accepts children up to age 21 who are enrolled in Medicaid or [place] Health Choice; pregnant women with a Medicaid card; and children who have applied for Medicaid or [place] Health Choice, but were declined, whose parents can pay a reduced fee at time of service.  °[facility] ° [address], [place] (336) 641-4533 Patients are seen by appointment only. Walk-ins are not accepted. [facility] will see patients 18 years of age and older. °Monday - Tuesday (8am-5pm) °Most Wednesdays (8:30-5pm) °$30 per visit, cash only  °[facility] ° [address], [place] (336) 641-4533 Patients are seen by appointment only. Walk-ins are not accepted. [facility] will see patients 18 years of age and older. °One   Wednesday Evening (Monthly: Volunteer Based).  $30 per visit, cash only  °[facility]  (919) 537-3737 for adults; Children under age 4, call Graduate Pediatric Dentistry at ([phone]) [phone]. Children aged 4-14, please call ([phone]) [phone] to request a pediatric application. ° Dental services are provided in all areas of dental care including fillings, crowns and bridges, complete and partial dentures, implants, gum treatment, root canals, and extractions. Preventive care is also provided. Treatment is provided to both adults and children. °Patients are selected via a lottery and there is often a waiting list. °  °[facility] [address], °[place] ° (336) 763-8833 [email or web address] °  °[facility] [address], [place], [place] (336)723-1848, Ext. 123 Second and Fourth Thursday of each month, opens at 6:30 AM; Clinic ends at 9 AM.  Patients are seen on a first-come first-served basis, and a limited number are seen during each clinic.  ° °[facility] ° [address], [place], [place] (336) [phone]    Eligibility Requirements °You must have lived in [place], [place], or [place] counties for at least the last three months. °  You cannot be eligible for state or federal sponsored healthcare insurance, including Veterans Administration, Medicaid, or Medicare. °  You generally cannot be eligible for healthcare insurance through your employer.  °  How to apply: °Eligibility screenings are held every Tuesday and Wednesday afternoon from 1:00 pm until 4:00 pm. You do not need an appointment for the interview!  °[facility] [address], [place], [place] 336-631-2330   °[facility]  336-342-8273   °[facility]  336-703-3100   °[facility]  336-570-6415   ° °Behavioral Health Resources in the Community: °Intensive Outpatient Programs °Organization         Address  Phone  Notes  °[facility] [address], [place], [place] 336-878-6098   °[facility] [address], [place], [place] 336-832-9800   °[facility] [address], [place], [place] ° 336-882-2125   °[facility] [address],  °[place], [place] 1-800-853-5163 or 336-641-4981   °Substance Abuse Resources °Organization         Address  Phone  Notes  °[facility]  336-882-2125   °[facility]  336-784-9470   °[facility]  336-285-9073   °[facility]  336-845-3988   °Residential & Outpatient Substance Abuse Program  1-800-659-3381   °Psychological Services °Organization         Address  Phone  Notes  °[facility]  336- 832-9600   °[facility]  336- 378-7881   °[facility] [address], [place] 1-800-853-5163 or 336-641-4981   ° °Mobile Crisis Teams °Organization         Address  Phone  Notes  °[facility], Mobile Crisis Care Unit  1-877-626-1772   °Assertive °[facility] ° [address]  [place], [place] 336-834-9664   °Sharon DeEsch [address] °[place] 336-554-5454   ° °Self-Help/Support Groups °Organization         Address  Phone             Notes  °[facility]  - variety of support groups  336- 373-1402 Call for more information  °Narcotics Anonymous (NA), [facility] [address], °[place]  2 meetings at this location  ° °  Residential Treatment Programs Organization         Address  Phone  Notes  [facility] [address]    [place]  8-850-277-4128   [facility]  [address] [place] [address], [place], [place] 209-470-9628   [facility] [address] [place], [place] 366-294-7654 Admissions: 8am-3pm M-F  [facility] [address],    [place], [place] 650-354-6568   [facility] [address] [place], [place], [place] 127-517-0017   [facility] [address]  [place], [place] 494-496-7591   [facility] Intensive Outpatient [address], [place], [place] 638-466-5993   [facility] ([facility] [address]  [place], [place] 5-701-779-3903 or (860)080-2012   [facility] ([facility] [address], [place], [place] 226-333-5456 Accepts Medicaid  [facility] [address]  [place] 2-563-893-7342 Substance Abuse/Addiction Treatment   [facility] [place] [facility]         Address  Phone  Notes  [facility]  763-154-7170   Angie Fava, PhD [address] [place] [address] [place], [place]   610-012-2327 or 605-522-9241   [facility]   [address] [place], [place] 8501643021   [facility] [address] [place], [place] [phone] Insurance/Medicaid/sponsorship through [facility] and Families [address]                                    [place], [place] (670)810-7588 Therapy/tele-psych/case    [facility] [address] [place], [place] (361)728-2222    Dr. Lolly Mustache  646-775-7892   [facility] [place]  [employer] [facility] 1) [address], [place] 2) [address], [place] 3)  [address], [place] (610)[phone]  (534)[phone]   [facility] Child Abuse Hotline (442)726-6900 or (617)718-2589 (After Hours)      Take your usual prescriptions as previously directed.  Call your regular medical doctor today to schedule a follow up appointment within the next 1 to 2 days. Call the Neurologist today to schedule a follow up appointment within the next week.  Return to the Emergency Department immediately sooner if worsening.

## 2015-01-24 ENCOUNTER — Encounter: Payer: Self-pay | Admitting: Vascular Surgery

## 2015-01-24 ENCOUNTER — Other Ambulatory Visit: Payer: Self-pay | Admitting: *Deleted

## 2015-01-24 DIAGNOSIS — I6523 Occlusion and stenosis of bilateral carotid arteries: Secondary | ICD-10-CM

## 2015-01-25 ENCOUNTER — Encounter: Payer: Self-pay | Admitting: Vascular Surgery

## 2015-01-27 ENCOUNTER — Encounter: Payer: Self-pay | Admitting: Vascular Surgery

## 2015-01-27 ENCOUNTER — Ambulatory Visit (HOSPITAL_COMMUNITY)
Admission: RE | Admit: 2015-01-27 | Discharge: 2015-01-27 | Disposition: A | Discharge status: Home / Self Care | Payer: Medicare Other | Source: Ambulatory Visit | Attending: Vascular Surgery | Admitting: Vascular Surgery

## 2015-01-27 ENCOUNTER — Ambulatory Visit (INDEPENDENT_AMBULATORY_CARE_PROVIDER_SITE_OTHER): Payer: Medicare Other | Admitting: Vascular Surgery

## 2015-01-27 VITALS — BP 147/88 | HR 54 | Ht 67.0 in | Wt 155.1 lb

## 2015-01-27 DIAGNOSIS — I639 Cerebral infarction, unspecified: Secondary | ICD-10-CM | POA: Diagnosis not present

## 2015-01-27 DIAGNOSIS — Z01818 Encounter for other preprocedural examination: Secondary | ICD-10-CM | POA: Diagnosis not present

## 2015-01-27 DIAGNOSIS — I6523 Occlusion and stenosis of bilateral carotid arteries: Secondary | ICD-10-CM | POA: Diagnosis not present

## 2015-01-27 DIAGNOSIS — G40909 Epilepsy, unspecified, not intractable, without status epilepticus: Secondary | ICD-10-CM | POA: Insufficient documentation

## 2015-01-27 DIAGNOSIS — I739 Peripheral vascular disease, unspecified: Secondary | ICD-10-CM

## 2015-01-27 DIAGNOSIS — I779 Disorder of arteries and arterioles, unspecified: Secondary | ICD-10-CM | POA: Insufficient documentation

## 2015-01-27 NOTE — Progress Notes (Addendum)
New Carotid Patient  Referred by:  Oval Linsey, MD [address] , [place] [address]  Reason for referral: right carotid stenosis  History of Present Illness  Ricky Sherman is a 64 y.o. (25-Mar-1951) male who presents with chief complaint: "blockage in neck".  Pt recently had a seizure and had a carotid duplex which demonstrated near occlusion in "neck artery".  Patient doesn't have copies of this study and doesn't remember which side.  He was told his seizures are due to this blockage.  Patient has prior history of TIA and stroke.  The patient has never had amaurosis fugax or monocular blindness.  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  The patient has never had receptive or expressive aphasia.   he patient's previous neurologic deficits have resolved.  The patient's risks factors for carotid disease include: DM, HTN, and prior smoking.  He quit smoking 2 weeks ago.  Past Medical History  Diagnosis Date  . Diabetes mellitus without complication   . Hypertension   . Seizures   . DDD (degenerative disc disease), lumbar   . DDD (degenerative disc disease), cervical   . Headache   . Weakness   . Stroke     right sided weakness  . Carotid artery occlusion     Past Surgical History  Procedure Laterality Date  . Eye surgery      History   Social History  . Marital Status: Widowed    Spouse Name: N/A  . Number of Children: N/A  . Years of Education: N/A   Occupational History  . Not on file.   Social History Main Topics  . Smoking status: Former Smoker    Types: Cigarettes    Quit date: 01/13/2015  . Smokeless tobacco: Not on file  . Alcohol Use: No  . Drug Use: No  . Sexual Activity: Not on file   Other Topics Concern  . Not on file   Social History Narrative    Family History  Problem Relation Age of Onset  . Cancer Mother   . Heart attack Mother   . Diabetes Sister     Current  Outpatient Prescriptions on File Prior to Visit  Medication Sig Dispense Refill  . amLODipine (NORVASC) 5 MG tablet Take 5 mg by mouth daily.    Marland Kitchen aspirin EC 325 MG tablet Take 325 mg by mouth every morning.    . Fish Oil-Cholecalciferol (FISH OIL + D3 PO) Take 1 capsule by mouth 2 (two) times daily.    Marland Kitchen levETIRAcetam (KEPPRA) 500 MG tablet Take 1 tablet (500 mg total) by mouth 2 (two) times daily. 60 tablet 0  . metFORMIN (GLUCOPHAGE) 500 MG tablet Take 500 mg by mouth 2 (two) times daily with a meal.    . metoprolol (LOPRESSOR) 100 MG tablet Take 100 mg by mouth 2 (two) times daily.     No current facility-administered medications on file prior to visit.    No Known Allergies   REVIEW OF SYSTEMS:  (Positives checked otherwise negative)  CARDIOVASCULAR:   [ ]  chest pain,  [ ]  chest pressure,  [ ]  palpitations,  [ ]  shortness of breath when laying flat,  [x]  shortness of breath with exertion,   [x]  pain in feet when walking,  [x]  pain in feet when laying flat, [ ]  history of blood clot in veins (DVT),  [ ]  history of phlebitis,  [x]  swelling in  legs,  [ ]  varicose veins  PULMONARY:   [ ]  productive cough,  [ ]  asthma,  [x]  wheezing  NEUROLOGIC:   [x]  weakness in arms or legs,  [x]  numbness in arms or legs,  [ ]  difficulty speaking or slurred speech,  [ ]  temporary loss of vision in one eye,  [x]  dizziness  HEMATOLOGIC:   [ ]  bleeding problems,  [ ]  problems with blood clotting too easily  MUSCULOSKEL:   [ ]  joint pain, [ ]  joint swelling  GASTROINTEST:   [ ]  vomiting blood,  [ ]  blood in stool     GENITOURINARY:   [ ]  burning with urination,  [ ]  blood in urine  PSYCHIATRIC:   [ ]  history of major depression  INTEGUMENTARY:   [ ]  rashes,  [ ]  ulcers  CONSTITUTIONAL:   [ ]  fever,  [ ]  chills   For VQI Use Only  PRE-ADM LIVING: Home  AMB STATUS: Ambulatory  CAD Sx: None  PRIOR CHF: None  STRESS TEST: [x]  No, [ ]  Normal, [ ]  + ischemia, [  ] + MI, [ ]  Both   Physical Examination  Filed Vitals:   01/27/15 1222 01/27/15 1223  BP: 149/79 147/88  Pulse: 54   Height: 5\' 7"  (1.702 m)   Weight: 155 lb 1.6 oz (70.353 kg)   SpO2: 96%    Body mass index is 24.29 kg/(m^2).  General: A&O x 3, WDWN  Head: [place]/AT  Ear/Nose/Throat: Hearing grossly intact, nares w/o erythema or drainage, oropharynx w/o Erythema/Exudate, Mallampati score:   Eyes: PERRLA, EOMI  Neck: Supple, no nuchal rigidity, no palpable LAD, on Sonosite evaluation: low carotid bifurcation, normal carotid visible distal to lesion  Pulmonary: Sym exp, good air movt, CTAB, no rales, rhonchi, & wheezing  Cardiac: RRR, Nl S1, S2, no Murmurs, rubs or gallops  Vascular: Vessel Right Left  Radial Palpable Palpable  Brachial Palpable Palpable  Carotid Palpable, without bruit Palpable, without bruit  Aorta  Not palpable N/A  Femoral Palpable Palpable  Popliteal Not palpable Not palpable  PT Palpable Palpable  DP Palpable Palpable   Gastrointestinal: soft, NTND, -G/R, - HSM, - masses, - CVAT B  Musculoskeletal: M/S 5/5 throughout , Extremities without ischemic changes , mild cyanosis in both feet  Neurologic: CN 2-12 intact , Pain and light touch intact in extremities , Motor exam as listed above  Psychiatric: Judgment intact, Mood & affect appropriate for pt's clinical situation  Dermatologic: See M/S exam for extremity exam, no rashes otherwise noted  Lymph : No Cervical, Axillary, or Inguinal lymphadenopathy   Laboratory: CBC:    Component Value Date/Time   HGB 15.6 12/29/2014 1440   HCT 46.0 12/29/2014 1440    BMP:    Component Value Date/Time   NA 140 12/29/2014 1440   K 4.2 12/29/2014 1440   CL 103 12/29/2014 1440   GLUCOSE 107* 12/29/2014 1440   BUN 6 12/29/2014 1440   CREATININE 0.80 12/29/2014 1440    Coagulation: No results found for: INR, PROTIME No results found for: PTT  Lipids: Outside lab (10/03/14): TC: 170, HGL 31, LDL  87 TG: 170 Hg A1c 5.9  Radiology:   Non-Invasive Vascular Imaging  CAROTID DUPLEX (Date: 01/27/2015):   R ICA stenosis: 80-99%  R VA: patent and antegrade  L ICA stenosis: 40-59%  L VA: patent and antegrade  MRI HEAD (12/13/14) 1. No acute intracranial abnormality. 2. Mild for age chronic small vessel ischemia.   Medical Decision Making  Ricky Sherman is a 64 y.o. male who presents with: prior TIA/CVA corresponds to L CVA seen on MRI, asx R ICA stenosis 80-99%.   Based on the patient's vascular studies and examination, I have offered the patient: cardiac risk stratification and optimization followed by likely R CEA.  Will need cardiac clearance to clear anesthesiology as pt has limited exercise tolerance.  I discussed in depth with the patient the nature of atherosclerosis, and emphasized the importance of maximal medical management including strict control of blood pressure, blood glucose, and lipid levels, obtaining regular exercise, antiplatelet agents, and cessation of smoking.   The patient is currently not on a statin: as not medically indicated with normal lipid profile.    The patient is currently on an anti-platelet: ASA.  The patient is aware that without maximal medical management the underlying atherosclerotic disease process will progress, limiting the benefit of any interventions.  Thank you for allowing [place] to participate in this patient's care.  Leonides Sake, MD [facility] [place] [facility] [place] Office: [phone] Pager: [phone]  01/27/2015, 1:00 PM

## 2015-01-30 NOTE — Addendum Note (Signed)
Addended by: Adria DillELDRIDGE-LEWIS, Quenesha Douglass L on: 01/30/2015 09:57 AM   Modules accepted: Orders

## 2015-01-31 ENCOUNTER — Encounter: Payer: Self-pay | Admitting: Family Medicine

## 2015-02-16 ENCOUNTER — Encounter: Payer: Self-pay | Admitting: Vascular Surgery

## 2015-02-17 ENCOUNTER — Ambulatory Visit (INDEPENDENT_AMBULATORY_CARE_PROVIDER_SITE_OTHER): Payer: Medicare Other | Admitting: Vascular Surgery

## 2015-02-17 ENCOUNTER — Encounter: Payer: Self-pay | Admitting: Vascular Surgery

## 2015-02-17 VITALS — BP 136/84 | HR 58 | Temp 98.3°F | Resp 14 | Ht 67.0 in | Wt 150.0 lb

## 2015-02-17 DIAGNOSIS — I6523 Occlusion and stenosis of bilateral carotid arteries: Secondary | ICD-10-CM

## 2015-02-17 DIAGNOSIS — I639 Cerebral infarction, unspecified: Secondary | ICD-10-CM | POA: Diagnosis not present

## 2015-02-17 NOTE — Progress Notes (Signed)
Established Carotid Patient  History of Present Illness  Ricky Sherman is a 64 y.o. (16-Apr-1951) male who presents with chief complaint: cardiology follow up.  Previous carotid studies demonstrated: RICA 80-99% stenosis, LICA 40-59% stenosis.  Patient has distant history of TIA or stroke symptoms.  The patient has never had amaurosis fugax or monocular blindness. 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 The patient has never had receptive or expressive aphasia. he patient's previous neurologic deficits have resolved.   Past Medical History  Diagnosis Date  . Diabetes mellitus without complication   . Hypertension   . Seizures   . DDD (degenerative disc disease), lumbar   . DDD (degenerative disc disease), cervical   . Headache   . Weakness   . Stroke     right sided weakness  . Carotid artery occlusion   . Seizure disorder Jan and December 29, 2014    Past Surgical History  Procedure Laterality Date  . Eye surgery      History   Social History  . Marital Status: Widowed    Spouse Name: N/A  . Number of Children: N/A  . Years of Education: N/A   Occupational History  . Not on file.   Social History Main Topics  . Smoking status: Light Tobacco Smoker    Types: Cigarettes  . Smokeless tobacco: Never Used  . Alcohol Use: No  . Drug Use: No  . Sexual Activity: Not on file   Other Topics Concern  . Not on file   Social History Narrative    Family History  Problem Relation Age of Onset  . Cancer Mother   . Heart attack Mother   . Diabetes Sister     Current Outpatient Prescriptions  Medication Sig Dispense Refill  . amLODipine (NORVASC) 5 MG tablet Take 5 mg by mouth daily.    Marland Kitchen aspirin EC 325 MG tablet Take 325 mg by mouth every morning.    . Fish Oil-Cholecalciferol (FISH OIL + D3 PO) Take 1 capsule by mouth 2 (two) times daily.    Marland Kitchen levETIRAcetam (KEPPRA) 500 MG tablet Take 1 tablet (500 mg  total) by mouth 2 (two) times daily. 60 tablet 0  . metFORMIN (GLUCOPHAGE) 500 MG tablet Take 500 mg by mouth 2 (two) times daily with a meal.    . metoprolol (LOPRESSOR) 100 MG tablet Take 100 mg by mouth 2 (two) times daily.    . naproxen (NAPROSYN) 500 MG tablet Take 500 mg by mouth 2 (two) times daily with a meal.    . oxyCODONE-acetaminophen (PERCOCET/ROXICET) 5-325 MG per tablet Take 1 tablet by mouth every 4 (four) hours as needed for severe pain.     No current facility-administered medications for this visit.     No Known Allergies   REVIEW OF SYSTEMS: (Positives checked otherwise negative)  CARDIOVASCULAR:   chest pain,   chest pressure,   palpitations,   shortness of breath when laying flat,   shortness of breath with exertion,   pain in feet when walking,   pain in feet when laying flat,  history of blood clot in veins (DVT),   history of phlebitis,   swelling in legs,   varicose veins  PULMONARY:   productive cough,   asthma,   wheezing  NEUROLOGIC:   weakness in arms or legs,   numbness in arms or legs,    difficulty speaking or slurred speech,   temporary loss of vision in one eye,   dizziness  HEMATOLOGIC:   bleeding problems,   problems with blood clotting too easily  MUSCULOSKEL:   joint pain,  joint swelling  GASTROINTEST:   vomiting blood,   blood in stool   GENITOURINARY:   burning with urination,   blood in urine  PSYCHIATRIC:   history of major depression  INTEGUMENTARY:   rashes,   ulcers  CONSTITUTIONAL:   fever,   chills    Physical Examination  Filed Vitals:   02/17/15 1524  BP: 136/84  Pulse: 58  Temp: 98.3 F (36.8 C)  TempSrc: Oral  Resp: 14  Height:  (1.702 m)  Weight: 150 lb (68.04 kg)  SpO2: 97%   Body mass index is 23.49 kg/(m^2).  General: A&O x 3,  WD, thin  Eyes: PERRLA, EOMI  Neck: Supple, no nuchal rigidity, no palpable LAD  Pulmonary: Sym exp, good air movt, CTAB, no rales, rhonchi, & wheezing  Cardiac: RRR, Nl S1, S2, no Murmurs, rubs or gallops  Vascular: Vessel Right Left  Radial Palpable Palpable  Brachial Palpable Palpable  Carotid Palpable, without bruit Palpable, without bruit  Aorta Not palpable N/A  Femoral Palpable Palpable  Popliteal Not palpable Not palpable  PT Palpable Palpable  DP Palpable Palpable   Gastrointestinal: soft, NTND, no G/R, no HSM, no masses, no CVAT B  Musculoskeletal: M/S 5/5 throughout , Extremities without ischemic changes   Neurologic: CN 2-12 intact , Pain and light touch intact in extremities , Motor exam as listed above   Medical Decision Making  Ricky Sherman is a 64 y.o. male who presents with: asx R ICA stenosis >80%, distal h/o L CVA, L ICA stenosis <70%   Reportedly, the patient's nuclear stress test was negative.  His cardiologist' office is closed so we can't get the results.  Based on the patient's vascular studies and examination, I have offered the patient: R CEA, tenatively scheduled for 22 AUG 16.  I discussed with the patient the risks, benefits, and alternatives to carotid endarterectomy.    I discussed the procedural details of carotid endarterectomy with the patient.    The patient is aware that the risks of carotid endarterectomy include but are not limited to: bleeding, infection, stroke, myocardial infarction, death, cranial nerve injuries both temporary and permanent, neck hematoma, possible airway compromise, labile blood pressure post-operatively, cerebral hyperperfusion syndrome, and possible need for additional interventions in the future.   The patient is aware of the risks and agrees to proceed forward with the procedure.  If the nuclear stress testing is positive, may need to consider R CAS.  I discussed in depth with the patient the nature of  atherosclerosis, and emphasized the importance of maximal medical management including strict control of blood pressure, blood glucose, and lipid levels, antiplatelet agents, obtaining regular exercise, and cessation of smoking.    The patient is aware that without maximal medical management the underlying atherosclerotic disease process will progress, limiting the benefit of any interventions. The patient is currently not on a statin: as not medically indicated. The patient is currently on an anti-platelet: ASA.  Thank you for allowing [place] to participate in this patient's care.   Leonides Sake, MD [facility] [place] Office: [phone] Pager: [phone]  02/17/2015, 5:04 PM

## 2015-02-20 ENCOUNTER — Other Ambulatory Visit: Payer: Self-pay

## 2015-02-21 ENCOUNTER — Encounter: Payer: Self-pay | Admitting: Vascular Surgery

## 2015-02-28 ENCOUNTER — Encounter (HOSPITAL_COMMUNITY)
Admission: RE | Admit: 2015-02-28 | Discharge: 2015-02-28 | Disposition: A | Discharge status: Home / Self Care | Payer: Medicare Other | Source: Ambulatory Visit | Attending: Vascular Surgery | Admitting: Vascular Surgery

## 2015-02-28 ENCOUNTER — Encounter (HOSPITAL_COMMUNITY): Payer: Self-pay

## 2015-02-28 DIAGNOSIS — I10 Essential (primary) hypertension: Secondary | ICD-10-CM | POA: Diagnosis not present

## 2015-02-28 DIAGNOSIS — I6523 Occlusion and stenosis of bilateral carotid arteries: Secondary | ICD-10-CM | POA: Insufficient documentation

## 2015-02-28 DIAGNOSIS — Z01818 Encounter for other preprocedural examination: Secondary | ICD-10-CM | POA: Insufficient documentation

## 2015-02-28 DIAGNOSIS — Z01812 Encounter for preprocedural laboratory examination: Secondary | ICD-10-CM | POA: Diagnosis not present

## 2015-02-28 DIAGNOSIS — Z79899 Other long term (current) drug therapy: Secondary | ICD-10-CM | POA: Diagnosis not present

## 2015-02-28 DIAGNOSIS — Z7982 Long term (current) use of aspirin: Secondary | ICD-10-CM | POA: Diagnosis not present

## 2015-02-28 DIAGNOSIS — Z8673 Personal history of transient ischemic attack (TIA), and cerebral infarction without residual deficits: Secondary | ICD-10-CM | POA: Insufficient documentation

## 2015-02-28 DIAGNOSIS — E119 Type 2 diabetes mellitus without complications: Secondary | ICD-10-CM | POA: Diagnosis not present

## 2015-02-28 DIAGNOSIS — Z0183 Encounter for blood typing: Secondary | ICD-10-CM | POA: Diagnosis not present

## 2015-02-28 DIAGNOSIS — H548 Legal blindness, as defined in USA: Secondary | ICD-10-CM | POA: Diagnosis not present

## 2015-02-28 DIAGNOSIS — K219 Gastro-esophageal reflux disease without esophagitis: Secondary | ICD-10-CM | POA: Insufficient documentation

## 2015-02-28 HISTORY — DX: Nocturia: R35.1

## 2015-02-28 HISTORY — DX: Adverse effect of unspecified anesthetic, initial encounter: T41.45XA

## 2015-02-28 HISTORY — DX: Peripheral vascular disease, unspecified: I73.9

## 2015-02-28 HISTORY — DX: Gastro-esophageal reflux disease without esophagitis: K21.9

## 2015-02-28 HISTORY — DX: Unspecified cataract: H26.9

## 2015-02-28 HISTORY — DX: Other complications of anesthesia, initial encounter: T88.59XA

## 2015-02-28 HISTORY — DX: Reserved for inherently not codable concepts without codable children: IMO0001

## 2015-02-28 HISTORY — DX: Legal blindness, as defined in USA: H54.8

## 2015-02-28 HISTORY — DX: Unspecified glaucoma: H40.9

## 2015-02-28 HISTORY — DX: Urgency of urination: R39.15

## 2015-02-28 LAB — COMPREHENSIVE METABOLIC PANEL
ALT: 14 U/L — AB (ref 17–63)
AST: 24 U/L (ref 15–41)
Albumin: 4.1 g/dL (ref 3.5–5.0)
Alkaline Phosphatase: 71 U/L (ref 38–126)
Anion gap: 10 (ref 5–15)
BILIRUBIN TOTAL: 0.5 mg/dL (ref 0.3–1.2)
BUN: 6 mg/dL (ref 6–20)
CHLORIDE: 100 mmol/L — AB (ref 101–111)
CO2: 26 mmol/L (ref 22–32)
CREATININE: 0.87 mg/dL (ref 0.61–1.24)
Calcium: 9.4 mg/dL (ref 8.9–10.3)
Glucose, Bld: 131 mg/dL — ABNORMAL HIGH (ref 65–99)
POTASSIUM: 4.6 mmol/L (ref 3.5–5.1)
Sodium: 136 mmol/L (ref 135–145)
TOTAL PROTEIN: 6.8 g/dL (ref 6.5–8.1)

## 2015-02-28 LAB — URINALYSIS, ROUTINE W REFLEX MICROSCOPIC
GLUCOSE, UA: NEGATIVE mg/dL
Hgb urine dipstick: NEGATIVE
Ketones, ur: 15 mg/dL — AB
LEUKOCYTES UA: NEGATIVE
NITRITE: NEGATIVE
PH: 5 (ref 5.0–8.0)
Protein, ur: NEGATIVE mg/dL
SPECIFIC GRAVITY, URINE: 1.037 — AB (ref 1.005–1.030)
Urobilinogen, UA: 1 mg/dL (ref 0.0–1.0)

## 2015-02-28 LAB — CBC
HEMATOCRIT: 46.9 % (ref 39.0–52.0)
Hemoglobin: 16.7 g/dL (ref 13.0–17.0)
MCH: 33 pg (ref 26.0–34.0)
MCHC: 35.6 g/dL (ref 30.0–36.0)
MCV: 92.7 fL (ref 78.0–100.0)
PLATELETS: 222 10*3/uL (ref 150–400)
RBC: 5.06 MIL/uL (ref 4.22–5.81)
RDW: 14.6 % (ref 11.5–15.5)
WBC: 8.4 10*3/uL (ref 4.0–10.5)

## 2015-02-28 LAB — SURGICAL PCR SCREEN
MRSA, PCR: NEGATIVE
STAPHYLOCOCCUS AUREUS: NEGATIVE

## 2015-02-28 LAB — PROTIME-INR
INR: 1.15 (ref 0.00–1.49)
PROTHROMBIN TIME: 14.9 s (ref 11.6–15.2)

## 2015-02-28 LAB — TYPE AND SCREEN
ABO/RH(D): O POS
Antibody Screen: NEGATIVE

## 2015-02-28 LAB — GLUCOSE, CAPILLARY: GLUCOSE-CAPILLARY: 148 mg/dL — AB (ref 65–99)

## 2015-02-28 LAB — APTT: aPTT: 30 seconds (ref 24–37)

## 2015-02-28 LAB — ABO/RH: ABO/RH(D): O POS

## 2015-02-28 NOTE — Pre-Procedure Instructions (Addendum)
Ricky Sherman  02/28/2015      [facility] - [place], [place] - [address] Jerolyn Shin [place] [address] Phone: [phone] Fax: [phone]    Your procedure is scheduled on 03/06/2015 at 730 AM.  Report to [facility] Admitting at 530 A.M.  Call this number if you have problems in the days leading up to your surgery:  [phone]  Call this number if you have problems the morning of surgery:  [phone]   Remember:  Do not eat food or drink liquids after midnight Sunday August 21st.  Take these medicines the morning of surgery with A SIP OF WATER Amlodipine (Norvasc), Levetiracetam (Keppra), Metoprolol (Lopressor), Oxycodone-acetaminophen (Percocet, if needed), Aspirin  STOP: ALL Vitamins, Supplements, Effient and Herbal Medications, Fish Oils, NSAIDs (Nonsteroidal Anti-inflammatories such as Ibuprofen, Aleve, or Advil), and Goody's/BC Powders 7 days prior to surgery, until after surgery as directed by your physician. This includes Naproxen, Omega-3 Fatty Acids (Fish Oil), and Vitamin D supplements.   How to Manage Your Diabetes Before Surgery   Why is it important to control my blood sugar before and after surgery?   Improving blood sugar levels before and after surgery helps healing and can limit problems.  A way of improving blood sugar control is eating a healthy diet by:  - Eating less sugar and carbohydrates  - Increasing activity/exercise  - Talk with your doctor about reaching your blood sugar goals  High blood sugars (greater than 180 mg/dL) can raise your risk of infections and slow down your recovery so you will need to focus on controlling your diabetes during the weeks before surgery.  Make sure that the doctor who takes care of your diabetes knows about your planned surgery including the date and location.  How do I manage my blood sugars before surgery?   Check your blood sugar at least 4 times a day, 2 days before surgery  to make sure that they are not too high or low.   Check your blood sugar the morning of your surgery when you wake up and every 2               hours until you get to the Short-Stay unit.  If your blood sugar is less than 70 mg/dL, you will need to treat for low blood sugar by:  Treat a low blood sugar (less than 70 mg/dL) with 1/2 cup of clear juice (cranberry or apple), 4 glucose tablets, OR glucose gel.  Recheck blood sugar in 15 minutes after treatment (to make sure it is greater than 70 mg/dL).  If blood sugar is not greater than 70 mg/dL on re-check, call [phone] for further instructions.   Report your blood sugar to the Short-Stay nurse when you get to Short-Stay.  References:  [facility], 2007 "How to Manage your Diabetes Before and After Surgery".  What do I do about my diabetes medications?   Do not take oral diabetes medicines (pills) the morning of surgery. This includes Metformin (Glucophage).    Do not take other diabetes injectables the day of surgery including Byetta, Victoza, Bydureon, and Trulicity.    If your CBG is greater than 220 mg/dL, you may take 1/2 of your sliding scale (correction) dose of insulin.    Do not wear jewelry, make-up or nail polish.  Do not wear lotions, powders, or perfumes.  You may wear deodorant.  Do not shave 48 hours  prior to surgery.    Do not bring valuables to the hospital.  [facility] is not responsible for any belongings or valuables.  Contacts, dentures or bridgework may not be worn into surgery.  Leave your suitcase in the car.  After surgery it may be brought to your room.  For patients admitted to the hospital, discharge time will be determined by your treatment team.  Patients discharged the day of surgery will not be allowed to drive home.   Special instructions:  Please follow these instructions carefully:  1. Shower with CHG Soap the night before surgery and the  morning of Surgery. 2. If you choose to wash your hair, wash your hair first as usual with your normal shampoo. 3. After you shampoo, rinse your hair and body thoroughly to remove the Shampoo. 4. Use CHG as you would any other liquid soap. You can apply chg directly to the skin and wash gently with scrungie or a clean washcloth. 5. Apply the CHG Soap to your body ONLY FROM THE NECK DOWN. Do not use on open wounds or open sores. Avoid contact with your eyes, ears, mouth and genitals (private parts). Wash genitals (private parts) with your normal soap. 6. Wash thoroughly, paying special attention to the area where your surgery will be performed. 7. Thoroughly rinse your body with warm water from the neck down. 8. DO NOT shower/wash with your normal soap after using and rinsing off the CHG Soap. 9. Pat yourself dry with a clean towel.  10. Wear clean pajamas.  11. Place clean sheets on your bed the night of your first shower and do not sleep with pets.  Day of Surgery  Do not apply any lotions/deodorants the morning of surgery. Please wear clean clothes to the hospital/surgery center.    Please read over the following fact sheets that you were given. Pain Booklet, Coughing and Deep Breathing, Blood Transfusion Information, MRSA Information and Surgical Site Infection Prevention

## 2015-02-28 NOTE — Progress Notes (Signed)
Patient's PCP is Ocie Bob Diego in [place], [place].  Patient was recently seen by Dr. Juliann Pares at [facility] for cardiac clearance and will f/u with him in February.  Otherwise denies having ever seen a cardiologist.    Stress test is in Care Everywhere on 02/13/2015 at [facility].  ECHO is in Care Everywhere on 02/13/2015 at [facility].  EKG is in EPIC from 12/29/2014.   Cardiac Clearance note is under Media tab in EPIC from 02/21/2015 from Dr. Juliann Pares.  Patient admits to having two seizures this year, one in June and one in February.  He was seen at [facility] ED for both of these seizures.  Patient denies seeing a neurologist and followed up with his PCP after these episodes.   Have instructed patient and daughter that he is to continue taking Aspirin through day of surgery.  Both verbalized understanding.

## 2015-02-28 NOTE — Progress Notes (Signed)
   02/28/15 1301  OBSTRUCTIVE SLEEP APNEA  Have you ever been diagnosed with sleep apnea through a sleep study? No  Do you snore loudly (loud enough to be heard through closed doors)?  0  Do you often feel tired, fatigued, or sleepy during the daytime? 1  Has anyone observed you stop breathing during your sleep? 0  Do you have, or are you being treated for high blood pressure? 1  BMI more than 35 kg/m2? 0  Age over 65 years old? 1  Neck circumference greater than 40 cm/16 inches? 0  Gender: 1

## 2015-02-28 NOTE — Progress Notes (Signed)
Anesthesia Chart Review:  Pt is 63 year old male scheduled for R CEA on 03/06/2015 with Dr. Imogene Burn.   PCP is Dr. Oval Linsey in [place].   PMH includes: HTN, stroke (2002; right sided weakness), PVD, carotid stenosis, DM, seizure disorder (2 in last year, January and June), GERD. Legally blind. Heavy tobacco user. BMI 23.   Pt reports anesthesia history of "woke up during colonoscopy".   Medications include: amlodipine, ASA, keppra, metformin, metoprolol, prilosec.   Preoperative labs reviewed.    EKG 12/29/2014: Sinus rhythm. Anteroseptal infarct, old.  Nuclear stress test 02/13/2015 (care everywhere): -Borderline Myoview scan borderline basal lateral mild defect no clear evidence of ischemia EF=55%  Echo 02/13/2015 (care everywhere): -Normal LV systolic function with mild LVH.  -Mild MR, TR, PR. Trivial AR.  -No valvular stenosis.  -Normal overall LV function EF greater than 55%  Carotid doppler US 01/27/2015:  -Doppler velocities suggest and 80-99% R proximal ICA stenosis and 40-59% L proximal ICA.   Pt saw Dr. Dorothyann Peng at [facility] for pre-op cardiac clearance (see care everywhere). Clearance form can be found in media tab 02/21/2015.   If no changes, I anticipate pt can proceed with surgery as scheduled.   Rica Mast, FNP-BC [facility]iology Phone: [phone] 02/28/2015 3:31 PM

## 2015-02-28 NOTE — Progress Notes (Signed)
Confirmed with Judeth Cornfield at Dr. Nicky Pugh office that patient is to continue taking 325 mg Aspirin until and on the day of surgery.

## 2015-03-01 LAB — HEMOGLOBIN A1C
Hgb A1c MFr Bld: 5.8 % — ABNORMAL HIGH (ref 4.8–5.6)
MEAN PLASMA GLUCOSE: 120 mg/dL

## 2015-03-05 MED ORDER — DEXTROSE 5 % IV SOLN
1.5000 g | INTRAVENOUS | Status: AC
  Administered 2015-03-06: 1.5 g via INTRAVENOUS
  Filled 2015-03-05 (×2): qty 1.5

## 2015-03-06 ENCOUNTER — Inpatient Hospital Stay (HOSPITAL_COMMUNITY): Payer: Medicare Other | Admitting: Vascular Surgery

## 2015-03-06 ENCOUNTER — Inpatient Hospital Stay (HOSPITAL_COMMUNITY): Payer: Medicare Other | Admitting: Certified Registered Nurse Anesthetist

## 2015-03-06 ENCOUNTER — Inpatient Hospital Stay (HOSPITAL_COMMUNITY)
Admission: RE | Admit: 2015-03-06 | Discharge: 2015-03-07 | DRG: 039 | Disposition: A | Discharge status: Home / Self Care | Payer: Medicare Other | Source: Ambulatory Visit | Attending: Vascular Surgery | Admitting: Vascular Surgery

## 2015-03-06 ENCOUNTER — Encounter (HOSPITAL_COMMUNITY): Payer: Self-pay | Admitting: *Deleted

## 2015-03-06 ENCOUNTER — Encounter (HOSPITAL_COMMUNITY)
Admission: RE | Disposition: A | Discharge status: Home / Self Care | Payer: Self-pay | Source: Ambulatory Visit | Attending: Vascular Surgery

## 2015-03-06 DIAGNOSIS — I10 Essential (primary) hypertension: Secondary | ICD-10-CM | POA: Diagnosis present

## 2015-03-06 DIAGNOSIS — Z79899 Other long term (current) drug therapy: Secondary | ICD-10-CM | POA: Diagnosis not present

## 2015-03-06 DIAGNOSIS — K219 Gastro-esophageal reflux disease without esophagitis: Secondary | ICD-10-CM | POA: Diagnosis present

## 2015-03-06 DIAGNOSIS — I6529 Occlusion and stenosis of unspecified carotid artery: Secondary | ICD-10-CM | POA: Diagnosis present

## 2015-03-06 DIAGNOSIS — M1990 Unspecified osteoarthritis, unspecified site: Secondary | ICD-10-CM | POA: Diagnosis present

## 2015-03-06 DIAGNOSIS — Z833 Family history of diabetes mellitus: Secondary | ICD-10-CM | POA: Diagnosis not present

## 2015-03-06 DIAGNOSIS — Z7982 Long term (current) use of aspirin: Secondary | ICD-10-CM

## 2015-03-06 DIAGNOSIS — I6523 Occlusion and stenosis of bilateral carotid arteries: Principal | ICD-10-CM | POA: Diagnosis present

## 2015-03-06 DIAGNOSIS — Z791 Long term (current) use of non-steroidal anti-inflammatories (NSAID): Secondary | ICD-10-CM | POA: Diagnosis not present

## 2015-03-06 DIAGNOSIS — F17210 Nicotine dependence, cigarettes, uncomplicated: Secondary | ICD-10-CM | POA: Diagnosis present

## 2015-03-06 DIAGNOSIS — H548 Legal blindness, as defined in USA: Secondary | ICD-10-CM | POA: Diagnosis present

## 2015-03-06 DIAGNOSIS — G40909 Epilepsy, unspecified, not intractable, without status epilepticus: Secondary | ICD-10-CM | POA: Diagnosis present

## 2015-03-06 DIAGNOSIS — Z8673 Personal history of transient ischemic attack (TIA), and cerebral infarction without residual deficits: Secondary | ICD-10-CM | POA: Diagnosis not present

## 2015-03-06 DIAGNOSIS — E1151 Type 2 diabetes mellitus with diabetic peripheral angiopathy without gangrene: Secondary | ICD-10-CM | POA: Diagnosis present

## 2015-03-06 DIAGNOSIS — I6521 Occlusion and stenosis of right carotid artery: Secondary | ICD-10-CM | POA: Diagnosis present

## 2015-03-06 HISTORY — PX: PATCH ANGIOPLASTY: SHX6230

## 2015-03-06 HISTORY — PX: ENDARTERECTOMY: SHX5162

## 2015-03-06 LAB — CBC
HEMATOCRIT: 37.4 % — AB (ref 39.0–52.0)
Hemoglobin: 13.6 g/dL (ref 13.0–17.0)
MCH: 33.4 pg (ref 26.0–34.0)
MCHC: 36.4 g/dL — ABNORMAL HIGH (ref 30.0–36.0)
MCV: 91.9 fL (ref 78.0–100.0)
PLATELETS: 167 10*3/uL (ref 150–400)
RBC: 4.07 MIL/uL — ABNORMAL LOW (ref 4.22–5.81)
RDW: 14.2 % (ref 11.5–15.5)
WBC: 9.3 10*3/uL (ref 4.0–10.5)

## 2015-03-06 LAB — CREATININE, SERUM
Creatinine, Ser: 0.73 mg/dL (ref 0.61–1.24)
GFR calc non Af Amer: >60 mL/min (ref >60)

## 2015-03-06 LAB — GLUCOSE, CAPILLARY
GLUCOSE-CAPILLARY: 183 mg/dL — AB (ref 65–99)
Glucose-Capillary: 107 mg/dL — ABNORMAL HIGH (ref 65–99)

## 2015-03-06 SURGERY — ENDARTERECTOMY, CAROTID
Anesthesia: General | Site: Neck | Laterality: Right

## 2015-03-06 MED ORDER — ENOXAPARIN SODIUM 40 MG/0.4ML ~~LOC~~ SOLN
40.0000 mg | SUBCUTANEOUS | Status: DC
  Filled 2015-03-06: qty 0.4

## 2015-03-06 MED ORDER — GLYCOPYRROLATE 0.2 MG/ML IJ SOLN
INTRAMUSCULAR | Status: AC
  Filled 2015-03-06: qty 2

## 2015-03-06 MED ORDER — CHLORHEXIDINE GLUCONATE CLOTH 2 % EX PADS: 6.0000 | MEDICATED_PAD | Freq: Once | CUTANEOUS | Status: DC

## 2015-03-06 MED ORDER — LIDOCAINE HCL (CARDIAC) 20 MG/ML IV SOLN
INTRAVENOUS | Status: DC | PRN
  Administered 2015-03-06: 80 mg via INTRAVENOUS

## 2015-03-06 MED ORDER — ONDANSETRON HCL 4 MG/2ML IJ SOLN: 4.0000 mg | Freq: Four times a day (QID) | INTRAMUSCULAR | Status: DC | PRN

## 2015-03-06 MED ORDER — THROMBIN 20000 UNITS EX SOLR
CUTANEOUS | Status: DC | PRN
  Administered 2015-03-06: 20 mL via TOPICAL

## 2015-03-06 MED ORDER — DEXTRAN 40 IN SALINE 10-0.9 % IV SOLN
INTRAVENOUS | Status: DC | PRN
  Administered 2015-03-06: 500 mL

## 2015-03-06 MED ORDER — MIDAZOLAM HCL 5 MG/5ML IJ SOLN
INTRAMUSCULAR | Status: DC | PRN
  Administered 2015-03-06: 1 mg via INTRAVENOUS

## 2015-03-06 MED ORDER — SODIUM CHLORIDE 0.9 % IV SOLN: INTRAVENOUS | Status: DC

## 2015-03-06 MED ORDER — PHENYLEPHRINE 40 MCG/ML (10ML) SYRINGE FOR IV PUSH (FOR BLOOD PRESSURE SUPPORT)
PREFILLED_SYRINGE | INTRAVENOUS | Status: AC
  Filled 2015-03-06: qty 30

## 2015-03-06 MED ORDER — OXYCODONE-ACETAMINOPHEN 5-325 MG PO TABS
1.0000 | ORAL_TABLET | ORAL | Status: DC | PRN
  Administered 2015-03-06 (×3): 2 via ORAL
  Administered 2015-03-07 (×2): 1 via ORAL
  Filled 2015-03-06: qty 1
  Filled 2015-03-06: qty 2
  Filled 2015-03-06: qty 1

## 2015-03-06 MED ORDER — SODIUM CHLORIDE 0.9 % IV SOLN
0.0125 ug/kg/min | INTRAVENOUS | Status: AC
  Administered 2015-03-06: .2 ug/kg/min via INTRAVENOUS
  Filled 2015-03-06: qty 2000

## 2015-03-06 MED ORDER — LEVETIRACETAM 500 MG PO TABS
500.0000 mg | ORAL_TABLET | Freq: Two times a day (BID) | ORAL | Status: DC
  Administered 2015-03-06 – 2015-03-07 (×2): 500 mg via ORAL
  Filled 2015-03-06 (×3): qty 1

## 2015-03-06 MED ORDER — PHENYLEPHRINE HCL 10 MG/ML IJ SOLN
INTRAMUSCULAR | Status: DC | PRN
  Administered 2015-03-06: 40 ug via INTRAVENOUS
  Administered 2015-03-06: 80 ug via INTRAVENOUS

## 2015-03-06 MED ORDER — ACETAMINOPHEN 325 MG PO TABS: 325.0000 mg | ORAL_TABLET | ORAL | Status: DC | PRN

## 2015-03-06 MED ORDER — OXYCODONE-ACETAMINOPHEN 5-325 MG PO TABS
ORAL_TABLET | ORAL | Status: AC
  Administered 2015-03-06: 2 via ORAL
  Filled 2015-03-06: qty 2

## 2015-03-06 MED ORDER — DEXAMETHASONE SODIUM PHOSPHATE 4 MG/ML IJ SOLN
INTRAMUSCULAR | Status: AC
  Filled 2015-03-06: qty 1

## 2015-03-06 MED ORDER — METFORMIN HCL 500 MG PO TABS
500.0000 mg | ORAL_TABLET | Freq: Two times a day (BID) | ORAL | Status: DC
  Administered 2015-03-06 – 2015-03-07 (×2): 500 mg via ORAL
  Filled 2015-03-06 (×4): qty 1

## 2015-03-06 MED ORDER — NEOSTIGMINE METHYLSULFATE 10 MG/10ML IV SOLN
INTRAVENOUS | Status: DC | PRN
  Administered 2015-03-06: 3 mg via INTRAVENOUS

## 2015-03-06 MED ORDER — MORPHINE SULFATE (PF) 2 MG/ML IV SOLN: 2.0000 mg | INTRAVENOUS | Status: DC | PRN

## 2015-03-06 MED ORDER — EPHEDRINE SULFATE 50 MG/ML IJ SOLN
INTRAMUSCULAR | Status: AC
  Filled 2015-03-06: qty 1

## 2015-03-06 MED ORDER — ALUM & MAG HYDROXIDE-SIMETH 200-200-20 MG/5ML PO SUSP: 15.0000 mL | ORAL | Status: DC | PRN

## 2015-03-06 MED ORDER — GLYCOPYRROLATE 0.2 MG/ML IJ SOLN
INTRAMUSCULAR | Status: DC | PRN
  Administered 2015-03-06: .3 mg via INTRAVENOUS
  Administered 2015-03-06 (×2): .2 mg via INTRAVENOUS

## 2015-03-06 MED ORDER — MEPERIDINE HCL 25 MG/ML IJ SOLN: 6.2500 mg | INTRAMUSCULAR | Status: DC | PRN

## 2015-03-06 MED ORDER — FENTANYL CITRATE (PF) 250 MCG/5ML IJ SOLN
INTRAMUSCULAR | Status: AC
  Filled 2015-03-06: qty 5

## 2015-03-06 MED ORDER — HEPARIN SODIUM (PORCINE) 1000 UNIT/ML IJ SOLN
INTRAMUSCULAR | Status: DC | PRN
  Administered 2015-03-06: 7 mL via INTRAVENOUS

## 2015-03-06 MED ORDER — PROPOFOL 10 MG/ML IV BOLUS
INTRAVENOUS | Status: AC
  Filled 2015-03-06: qty 20

## 2015-03-06 MED ORDER — ONDANSETRON HCL 4 MG/2ML IJ SOLN
INTRAMUSCULAR | Status: AC
  Filled 2015-03-06: qty 6

## 2015-03-06 MED ORDER — ADULT MULTIVITAMIN W/MINERALS CH
1.0000 | ORAL_TABLET | Freq: Every day | ORAL | Status: DC
  Administered 2015-03-06 – 2015-03-07 (×2): 1 via ORAL
  Filled 2015-03-06 (×2): qty 1

## 2015-03-06 MED ORDER — METOPROLOL TARTRATE 100 MG PO TABS
100.0000 mg | ORAL_TABLET | Freq: Two times a day (BID) | ORAL | Status: DC
  Administered 2015-03-06 – 2015-03-07 (×2): 100 mg via ORAL
  Filled 2015-03-06 (×3): qty 1

## 2015-03-06 MED ORDER — METOPROLOL TARTRATE 1 MG/ML IV SOLN: 2.0000 mg | INTRAVENOUS | Status: DC | PRN

## 2015-03-06 MED ORDER — PROMETHAZINE HCL 25 MG/ML IJ SOLN: 6.2500 mg | INTRAMUSCULAR | Status: DC | PRN

## 2015-03-06 MED ORDER — HYDROMORPHONE HCL 1 MG/ML IJ SOLN
INTRAMUSCULAR | Status: AC
  Administered 2015-03-06: 0.25 mg via INTRAVENOUS
  Filled 2015-03-06: qty 1

## 2015-03-06 MED ORDER — HYDROMORPHONE HCL 1 MG/ML IJ SOLN
INTRAMUSCULAR | Status: AC
  Administered 2015-03-06: 0.5 mg via INTRAVENOUS
  Filled 2015-03-06: qty 1

## 2015-03-06 MED ORDER — PANTOPRAZOLE SODIUM 40 MG PO TBEC
40.0000 mg | DELAYED_RELEASE_TABLET | Freq: Every day | ORAL | Status: DC
  Administered 2015-03-06 – 2015-03-07 (×2): 40 mg via ORAL
  Filled 2015-03-06: qty 1

## 2015-03-06 MED ORDER — GUAIFENESIN-DM 100-10 MG/5ML PO SYRP
15.0000 mL | ORAL_SOLUTION | ORAL | Status: DC | PRN
  Filled 2015-03-06: qty 15

## 2015-03-06 MED ORDER — LIDOCAINE HCL (CARDIAC) 20 MG/ML IV SOLN
INTRAVENOUS | Status: AC
  Filled 2015-03-06: qty 5

## 2015-03-06 MED ORDER — SODIUM CHLORIDE 0.9 % IJ SOLN
INTRAMUSCULAR | Status: AC
  Filled 2015-03-06: qty 10

## 2015-03-06 MED ORDER — LACTATED RINGERS IV SOLN
INTRAVENOUS | Status: DC | PRN
Start: 2015-03-06 — End: 2015-03-06
  Administered 2015-03-06 (×2): via INTRAVENOUS

## 2015-03-06 MED ORDER — SODIUM CHLORIDE 0.9 % IV SOLN: 500.0000 mL | Freq: Once | INTRAVENOUS | Status: DC | PRN

## 2015-03-06 MED ORDER — LIDOCAINE HCL (PF) 1 % IJ SOLN
INTRAMUSCULAR | Status: AC
  Filled 2015-03-06: qty 30

## 2015-03-06 MED ORDER — AMLODIPINE BESYLATE 5 MG PO TABS
5.0000 mg | ORAL_TABLET | Freq: Every day | ORAL | Status: DC
  Administered 2015-03-07: 5 mg via ORAL
  Filled 2015-03-06: qty 1

## 2015-03-06 MED ORDER — SODIUM CHLORIDE 0.9 % IR SOLN
Status: DC | PRN
  Administered 2015-03-06: 500 mL

## 2015-03-06 MED ORDER — DEXTRAN 40 IN SALINE 10-0.9 % IV SOLN
INTRAVENOUS | Status: AC
  Filled 2015-03-06: qty 500

## 2015-03-06 MED ORDER — ROCURONIUM BROMIDE 50 MG/5ML IV SOLN
INTRAVENOUS | Status: AC
  Filled 2015-03-06: qty 1

## 2015-03-06 MED ORDER — HYDRALAZINE HCL 20 MG/ML IJ SOLN: 5.0000 mg | INTRAMUSCULAR | Status: DC | PRN

## 2015-03-06 MED ORDER — POTASSIUM CHLORIDE CRYS ER 20 MEQ PO TBCR: 20.0000 meq | EXTENDED_RELEASE_TABLET | Freq: Every day | ORAL | Status: DC | PRN

## 2015-03-06 MED ORDER — INSULIN ASPART 100 UNIT/ML ~~LOC~~ SOLN: 0.0000 [IU] | Freq: Three times a day (TID) | SUBCUTANEOUS | Status: DC

## 2015-03-06 MED ORDER — CHOLECALCIFEROL 10 MCG (400 UNIT) PO TABS
400.0000 [IU] | ORAL_TABLET | Freq: Every day | ORAL | Status: DC
  Administered 2015-03-06 – 2015-03-07 (×2): 400 [IU] via ORAL
  Filled 2015-03-06 (×2): qty 1

## 2015-03-06 MED ORDER — ONDANSETRON HCL 4 MG/2ML IJ SOLN
INTRAMUSCULAR | Status: DC | PRN
  Administered 2015-03-06 (×2): 4 mg via INTRAVENOUS

## 2015-03-06 MED ORDER — FENTANYL CITRATE (PF) 100 MCG/2ML IJ SOLN
INTRAMUSCULAR | Status: DC | PRN
  Administered 2015-03-06: 25 ug via INTRAVENOUS
  Administered 2015-03-06: 50 ug via INTRAVENOUS

## 2015-03-06 MED ORDER — THROMBIN 20000 UNITS EX SOLR
CUTANEOUS | Status: AC
  Filled 2015-03-06: qty 20000

## 2015-03-06 MED ORDER — DOCUSATE SODIUM 100 MG PO CAPS
100.0000 mg | ORAL_CAPSULE | Freq: Every day | ORAL | Status: DC
  Administered 2015-03-07: 100 mg via ORAL
  Filled 2015-03-06: qty 1

## 2015-03-06 MED ORDER — SODIUM CHLORIDE 0.9 % IV SOLN
INTRAVENOUS | Status: DC
  Administered 2015-03-06: 18:00:00 via INTRAVENOUS

## 2015-03-06 MED ORDER — MAGNESIUM SULFATE 2 GM/50ML IV SOLN: 2.0000 g | Freq: Every day | INTRAVENOUS | Status: DC | PRN

## 2015-03-06 MED ORDER — LABETALOL HCL 5 MG/ML IV SOLN: 10.0000 mg | INTRAVENOUS | Status: DC | PRN

## 2015-03-06 MED ORDER — PROTAMINE SULFATE 10 MG/ML IV SOLN
INTRAVENOUS | Status: DC | PRN
  Administered 2015-03-06: 30 mg via INTRAVENOUS

## 2015-03-06 MED ORDER — MIDAZOLAM HCL 2 MG/2ML IJ SOLN
INTRAMUSCULAR | Status: AC
  Filled 2015-03-06: qty 4

## 2015-03-06 MED ORDER — 0.9 % SODIUM CHLORIDE (POUR BTL) OPTIME
TOPICAL | Status: DC | PRN
  Administered 2015-03-06: 2500 mL

## 2015-03-06 MED ORDER — PHENYLEPHRINE HCL 10 MG/ML IJ SOLN
10.0000 mg | INTRAVENOUS | Status: DC | PRN
  Administered 2015-03-06: 20 ug/min via INTRAVENOUS

## 2015-03-06 MED ORDER — HYDROMORPHONE HCL 1 MG/ML IJ SOLN
0.2500 mg | INTRAMUSCULAR | Status: DC | PRN
  Administered 2015-03-06: 0.5 mg via INTRAVENOUS
  Administered 2015-03-06: 0.25 mg via INTRAVENOUS
  Administered 2015-03-06: 0.5 mg via INTRAVENOUS
  Administered 2015-03-06: 0.25 mg via INTRAVENOUS

## 2015-03-06 MED ORDER — LACTATED RINGERS IV SOLN: INTRAVENOUS | Status: DC

## 2015-03-06 MED ORDER — EPHEDRINE SULFATE 50 MG/ML IJ SOLN
INTRAMUSCULAR | Status: DC | PRN
  Administered 2015-03-06: 10 mg via INTRAVENOUS
  Administered 2015-03-06 (×2): 5 mg via INTRAVENOUS

## 2015-03-06 MED ORDER — ROCURONIUM BROMIDE 100 MG/10ML IV SOLN
INTRAVENOUS | Status: DC | PRN
  Administered 2015-03-06: 40 mg via INTRAVENOUS

## 2015-03-06 MED ORDER — PROPOFOL 10 MG/ML IV BOLUS
INTRAVENOUS | Status: DC | PRN
  Administered 2015-03-06: 150 mg via INTRAVENOUS

## 2015-03-06 MED ORDER — ACETAMINOPHEN 650 MG RE SUPP: 325.0000 mg | RECTAL | Status: DC | PRN

## 2015-03-06 MED ORDER — PHENOL 1.4 % MT LIQD: 1.0000 | OROMUCOSAL | Status: DC | PRN

## 2015-03-06 MED ORDER — DEXAMETHASONE SODIUM PHOSPHATE 4 MG/ML IJ SOLN
INTRAMUSCULAR | Status: DC | PRN
  Administered 2015-03-06: 4 mg via INTRAVENOUS

## 2015-03-06 MED ORDER — DEXTROSE 5 % IV SOLN
1.5000 g | Freq: Two times a day (BID) | INTRAVENOUS | Status: AC
  Administered 2015-03-06 – 2015-03-07 (×2): 1.5 g via INTRAVENOUS
  Filled 2015-03-06 (×2): qty 1.5

## 2015-03-06 MED ORDER — SUCCINYLCHOLINE CHLORIDE 20 MG/ML IJ SOLN
INTRAMUSCULAR | Status: AC
  Filled 2015-03-06: qty 2

## 2015-03-06 MED ORDER — ASPIRIN EC 325 MG PO TBEC
325.0000 mg | DELAYED_RELEASE_TABLET | Freq: Every morning | ORAL | Status: DC
  Administered 2015-03-07: 325 mg via ORAL
  Filled 2015-03-06: qty 1

## 2015-03-06 SURGICAL SUPPLY — 54 items
BAG DECANTER FOR FLEXI CONT (MISCELLANEOUS) ×3 IMPLANT
BLADE CLIPPER SURG (BLADE) ×3 IMPLANT
CANISTER SUCTION 2500CC (MISCELLANEOUS) ×3 IMPLANT
CATH ROBINSON RED A/P 18FR (CATHETERS) ×3 IMPLANT
CATH SUCT 10FR WHISTLE TIP (CATHETERS) IMPLANT
CLIP TI MEDIUM 6 (CLIP) ×3 IMPLANT
CLIP TI WIDE RED SMALL 6 (CLIP) ×3 IMPLANT
COVER PROBE W GEL 5X96 (DRAPES) ×3 IMPLANT
CRADLE DONUT ADULT HEAD (MISCELLANEOUS) ×3 IMPLANT
DRAPE WARM FLUID 44X44 (DRAPE) ×3 IMPLANT
ELECT REM PT RETURN 9FT ADLT (ELECTROSURGICAL) ×3
ELECTRODE REM PT RTRN 9FT ADLT (ELECTROSURGICAL) ×1 IMPLANT
GAUZE SPONGE 4X4 12PLY STRL (GAUZE/BANDAGES/DRESSINGS) IMPLANT
GLOVE BIO SURGEON STRL SZ 6.5 (GLOVE) ×2 IMPLANT
GLOVE BIO SURGEON STRL SZ7 (GLOVE) ×3 IMPLANT
GLOVE BIO SURGEONS STRL SZ 6.5 (GLOVE) ×1
GLOVE BIOGEL PI IND STRL 6.5 (GLOVE) ×3 IMPLANT
GLOVE BIOGEL PI IND STRL 7.5 (GLOVE) ×1 IMPLANT
GLOVE BIOGEL PI INDICATOR 6.5 (GLOVE) ×6
GLOVE BIOGEL PI INDICATOR 7.5 (GLOVE) ×2
GLOVE ECLIPSE 7.0 STRL STRAW (GLOVE) ×3 IMPLANT
GOWN STRL REUS W/ TWL LRG LVL3 (GOWN DISPOSABLE) ×3 IMPLANT
GOWN STRL REUS W/TWL LRG LVL3 (GOWN DISPOSABLE) ×6
IV ADAPTER SYR DOUBLE MALE LL (MISCELLANEOUS) IMPLANT
KIT BASIN OR (CUSTOM PROCEDURE TRAY) ×3 IMPLANT
KIT ROOM TURNOVER OR (KITS) ×3 IMPLANT
KIT SHUNT ARGYLE CAROTID ART 6 (VASCULAR PRODUCTS) ×3 IMPLANT
LIQUID BAND (GAUZE/BANDAGES/DRESSINGS) ×3 IMPLANT
NS IRRIG 1000ML POUR BTL (IV SOLUTION) ×9 IMPLANT
PACK CAROTID (CUSTOM PROCEDURE TRAY) ×3 IMPLANT
PAD ARMBOARD 7.5X6 YLW CONV (MISCELLANEOUS) ×6 IMPLANT
PATCH VASC XENOSURE 1CMX6CM (Vascular Products) ×2 IMPLANT
PATCH VASC XENOSURE 1X6 (Vascular Products) ×1 IMPLANT
SET COLLECT BLD 21X3/4 12 PB (MISCELLANEOUS) IMPLANT
SHUNT CAROTID BYPASS 10 (VASCULAR PRODUCTS) IMPLANT
SHUNT CAROTID BYPASS 12FRX15.5 (VASCULAR PRODUCTS) IMPLANT
SPONGE INTESTINAL PEANUT (DISPOSABLE) ×3 IMPLANT
SPONGE SURGIFOAM ABS GEL 100 (HEMOSTASIS) IMPLANT
STOPCOCK 4 WAY LG BORE MALE ST (IV SETS) IMPLANT
SUT ETHILON 3 0 PS 1 (SUTURE) IMPLANT
SUT MNCRL AB 4-0 PS2 18 (SUTURE) ×3 IMPLANT
SUT PROLENE 6 0 BV (SUTURE) ×6 IMPLANT
SUT PROLENE 7 0 BV 1 (SUTURE) ×3 IMPLANT
SUT SILK 3 0 (SUTURE) ×2
SUT SILK 3 0 TIES 17X18 (SUTURE)
SUT SILK 3-0 18XBRD TIE 12 (SUTURE) ×1 IMPLANT
SUT SILK 3-0 18XBRD TIE BLK (SUTURE) IMPLANT
SUT VIC AB 3-0 SH 27 (SUTURE) ×2
SUT VIC AB 3-0 SH 27X BRD (SUTURE) ×1 IMPLANT
SYR TB 1ML LUER SLIP (SYRINGE) IMPLANT
SYSTEM CHEST DRAIN TLS 7FR (DRAIN) IMPLANT
TUBING ART PRESS 48 MALE/FEM (TUBING) IMPLANT
TUBING EXTENTION W/L.L. (IV SETS) IMPLANT
WATER STERILE IRR 1000ML POUR (IV SOLUTION) ×3 IMPLANT

## 2015-03-06 NOTE — Op Note (Signed)
OPERATIVE NOTE  PROCEDURE:   1.  right carotid endarterectomy with bovine patch angioplasty 2.  right intraoperative carotid ultrasound  PRE-OPERATIVE DIAGNOSIS: right asymptomatic carotid stenosis >80%  POST-OPERATIVE DIAGNOSIS: same as above   SURGEON: Leonides Sake, MD  ASSISTANT(S): Karsten Ro, PAC   ANESTHESIA: general  ESTIMATED BLOOD LOSS: 50 cc  FINDING(S): 1.  <30% residual stenosis in right distal common carotid artery  2. Continuous Doppler audible flow signatures are appropriate for each carotid artery. 3.  No evidence of intimal flap visualized on transverse or longitudinal ultrasonography. 4.  Necrotic core calcified carotid plaque.  SPECIMEN(S):  Carotid plaque (sent to Pathology)  INDICATIONS:   Ricky Sherman is a 64 y.o. male who presents with right asymptomatic carotid stenosis >80%.  I discussed with the patient the risks, benefits, and alternatives to carotid endarterectomy.  I discussed the procedural details of carotid endarterectomy with the patient.  The patient is aware that the risks of carotid endarterectomy include but are not limited to: bleeding, infection, stroke, myocardial infarction, death, cranial nerve injuries both temporary and permanent, neck hematoma, possible airway compromise, labile blood pressure post-operatively, cerebral hyperperfusion syndrome, and possible need for additional interventions in the future. The patient is aware of the risks and agrees to proceed forward with the procedure.  DESCRIPTION: After full informed written consent was obtained from the patient, the patient was brought back to the operating room and placed supine upon the operating table.  Prior to induction, the patient received IV antibiotics.  After obtaining adequate anesthesia, the patient was placed into semi-Fowler position with a shoulder roll in place and the patient's neck slightly hyperextended and rotated away from the surgical site.  The patient was prepped  in the standard fashion for a right carotid endarterectomy.  I made an incision anterior to the sternocleidomastoid muscle and dissected down through the subcutaneous tissue.  The platysmas was opened with electrocautery.  Then I dissected down to the internal jugular vein.  This was dissected posteriorly until I obtained visualization of the common carotid artery.  This was dissected out and then an umbilical tape was placed around the common carotid artery and I loosely applied a Rumel tourniquet.  I then dissected in a periadventitial fashion along the common carotid artery up to the bifurcation.  I then identified the external carotid artery and the superior thyroid artery.  A 2-0 silk tie was looped around the superior thyroid artery, and I also dissected out the external carotid artery and placed a vessel loop around it.  In continuing the dissection to the internal carotid artery, I identified the facial vein.  This was ligated and then transected, giving me improved exposure of the internal carotid artery.  In the process of this dissection, the hypoglossal nerve was identified.  I then dissected out the internal carotid artery until I identified an area of soft tissue in the internal carotid artery.  I dissected slightly distal to this area, and placed an umbilical tape around the artery and loosely applied a Rumel tourniquet.  At this point, we gave the patient a therapeutic bolus of Heparin intravenously (roughly 80 units/kg).  After waiting 3 minutes, then I clamped the internal carotid artery, external carotid artery and then the common carotid artery.  I then made an arteriotomy in the common carotid artery with a 11 blade, and extended the arteriotomy with a Potts scissor down into the common carotid artery, then I carried the arteriotomy through the bifurcation into the  internal carotid artery until I reached an area that was not diseased.  At this point, I took the 10 shunt that previously been  prepared and I inserted it into the internal carotid artery.  The Rumel tourniquet was then applied to this end of the shunt.  I unclamped the shunt to verify retrograde blood flow in the internal carotid artery.  I then placed the other end of the shunt into the common carotid artery after unclamping the artery.  The Rumel was tightened down around the shunt.  At this point, I verified blood flow in the shunt with a continuous doppler.  At this point, I started the endarterectomy in the common carotid artery with a Cytogeneticist and carried this dissection down into the common carotid artery circumferentially.  Then I transected the plaque at a segment where it was adherent.  I then carried this dissection up into the external carotid artery.  The plaque was extracted by unclamping the external carotid artery and everting the artery.  The dissection was then carried into the internal carotid artery, extracting the remaining portion of the carotid plaque.  I passed the plaque off the field as a specimen.  I then spent the next 30 minutes removing intimal flaps and loose debris.  Eventually I reached the point where the residual plaque was densely adherent and any further dissection would compromise the integrity of the wall.  After verifying that there was no more loose intimal flaps or debris, I re-interrogated the entirety of this carotid artery.  At this point, I was satisfied that the minimal remaining disease was densely adherent to the wall and wall integrity was intact.  At this point, I then fashioned a bovine pericardial patch for the geometry of this artery and sewed it in place with two running stitch of 6-0 Prolene, one from each end.  Prior to completing this patch angioplasty, I removed the shunt first from the internal carotid artery, from which there was excellent backbleeding, and clamped it.  Then I removed the shunt from the common carotid artery, from which there was excellent antegrade  bleeding, and then clamped it.  At this point, I allowed the external carotid artery to backbleed, which was excellent.  Then I instilled heparinized saline in this patched artery and then completed the patch angioplasty in the usual fashion.  First, I released the clamp on the external carotid artery, then I released it on the common carotid artery.  After waiting a few seconds, I then released it on the internal carotid artery.  I then interrogated this patient's arteries with the continuous Doppler.  The audible waveforms in each artery were consistent with the expected characteristics for each artery.  The Sonosite probe was then sterilely draped and used to interrogate the carotid artery in both longitudinal and transverse views.  At this point, I washed out the wound, and placed thrombin and Gelfoam throughout.  I also gave the patient 30 mg of protamine to reverse his anticoagulation.   After waiting a few minutes, I removed the thrombin and Gelfoam and washed out the wound.  There was no more active bleeding in the surgical site.   I then reapproximated the platysma muscle with a running stitch of 3-0 Vicryl.  The skin was then reapproximated with a running subcuticular 4-0 Monocryl stitch.  The skin was then cleaned, dried and Dermabond was used to reinforce the skin closure.  The patient woke without any problems, neurologically intact.  COMPLICATIONS: none  CONDITION: stable   Leonides Sake, MD [facility] Office: [phone] Pager: [phone]  03/06/2015, 10:17 AM

## 2015-03-06 NOTE — Anesthesia Preprocedure Evaluation (Addendum)
Anesthesia Evaluation  Patient identified by MRN, date of birth, ID band Patient awake    Reviewed: Allergy & Precautions, NPO status , Patient's Chart, lab work & pertinent test results, reviewed documented beta blocker date and time   History of Anesthesia Complications (+) AWARENESS UNDER ANESTHESIA and history of anesthetic complications  Airway Mallampati: I       Dental  (+) Edentulous Upper, Missing, Dental Advisory Given, Poor Dentition   Pulmonary shortness of breath, Current Smoker,    + decreased breath sounds      Cardiovascular hypertension, DVT Rhythm:Regular Rate:Normal     Neuro/Psych Seizures -, Poorly Controlled,  CVA negative psych ROS   GI/Hepatic Neg liver ROS, GERD-  Medicated,  Endo/Other  diabetes, Type 2, Oral Hypoglycemic Agents  Renal/GU   negative genitourinary   Musculoskeletal negative musculoskeletal ROS (+) Arthritis -,   Abdominal   Peds negative pediatric ROS (+)  Hematology negative hematology ROS (+)   Anesthesia Other Findings   Reproductive/Obstetrics negative OB ROS                           Lab Results  Component Value Date   WBC 8.4 02/28/2015   HGB 16.7 02/28/2015   HCT 46.9 02/28/2015   MCV 92.7 02/28/2015   PLT 222 02/28/2015   Lab Results  Component Value Date   CREATININE 0.87 02/28/2015   BUN 6 02/28/2015   NA 136 02/28/2015   K 4.6 02/28/2015   CL 100* 02/28/2015   CO2 26 02/28/2015   EKG: normal sinus rhythm.   Anesthesia Physical Anesthesia Plan  ASA: III  Anesthesia Plan: General   Post-op Pain Management:    Induction: Intravenous  Airway Management Planned: Oral ETT  Additional Equipment: Arterial line  Intra-op Plan:   Post-operative Plan: Extubation in OR  Informed Consent: I have reviewed the patients History and Physical, chart, labs and discussed the procedure including the risks, benefits and  alternatives for the proposed anesthesia with the patient or authorized representative who has indicated his/her understanding and acceptance.   Dental advisory given  Plan Discussed with: CRNA  Anesthesia Plan Comments:         Anesthesia Quick Evaluation

## 2015-03-06 NOTE — Anesthesia Procedure Notes (Signed)
Procedure Name: Intubation Date/Time: 03/06/2015 8:07 AM Performed by: Daiva Eves Pre-anesthesia Checklist: Patient identified, Patient being monitored, Emergency Drugs available and Suction available Patient Re-evaluated:Patient Re-evaluated prior to inductionOxygen Delivery Method: Circle system utilized Preoxygenation: Pre-oxygenation with 100% oxygen Intubation Type: IV induction Ventilation: Oral airway inserted - appropriate to patient size and Two handed mask ventilation required Laryngoscope Size: Mac and 3 Grade View: Grade I Tube type: Oral Tube size: 7.5 mm Number of attempts: 1 Airway Equipment and Method: Stylet Secured at: 22 cm Tube secured with: Tape Dental Injury: Teeth and Oropharynx as per pre-operative assessment

## 2015-03-06 NOTE — Anesthesia Postprocedure Evaluation (Signed)
  Anesthesia Post-op Note  Patient: Ricky Sherman  Procedure(s) Performed: Procedure(s): RIGHT CAROTID ENDARTERECTOMY (Right) PATCH ANGIOPLASTY USING XENOSURE BIOLOGIC  PATCH 1cm x 6cm (Right)  Patient Location: PACU  Anesthesia Type:General  Level of Consciousness: awake, alert  and oriented  Airway and Oxygen Therapy: Patient Spontanous Breathing  Post-op Pain: minimal  Post-op Assessment: Post-op Vital signs reviewed and Patient's Cardiovascular Status Stable LLE Motor Response: Responds to commands, Purposeful movement   RLE Motor Response: Responds to commands, Purposeful movement RLE Sensation: Full sensation, No numbness, No pain, No tingling      Post-op Vital Signs: Reviewed and stable  Last Vitals:  Filed Vitals:   03/06/15 1200  BP: 91/50  Pulse: 66  Temp:   Resp: 15    Complications: No apparent anesthesia complications

## 2015-03-06 NOTE — H&P (View-Only) (Signed)
Established Carotid Patient  History of Present Illness  Ricky Sherman is a 64 y.o. (16-Apr-1951) male who presents with chief complaint: cardiology follow up.  Previous carotid studies demonstrated: RICA 80-99% stenosis, LICA 40-59% stenosis.  Patient has distant history of TIA or stroke symptoms.  The patient has never had amaurosis fugax or monocular blindness. 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 The patient has never had receptive or expressive aphasia. he patient's previous neurologic deficits have resolved.   Past Medical History  Diagnosis Date  . Diabetes mellitus without complication   . Hypertension   . Seizures   . DDD (degenerative disc disease), lumbar   . DDD (degenerative disc disease), cervical   . Headache   . Weakness   . Stroke     right sided weakness  . Carotid artery occlusion   . Seizure disorder Jan and December 29, 2014    Past Surgical History  Procedure Laterality Date  . Eye surgery      History   Social History  . Marital Status: Widowed    Spouse Name: N/A  . Number of Children: N/A  . Years of Education: N/A   Occupational History  . Not on file.   Social History Main Topics  . Smoking status: Light Tobacco Smoker    Types: Cigarettes  . Smokeless tobacco: Never Used  . Alcohol Use: No  . Drug Use: No  . Sexual Activity: Not on file   Other Topics Concern  . Not on file   Social History Narrative    Family History  Problem Relation Age of Onset  . Cancer Mother   . Heart attack Mother   . Diabetes Sister     Current Outpatient Prescriptions  Medication Sig Dispense Refill  . amLODipine (NORVASC) 5 MG tablet Take 5 mg by mouth daily.    Marland Kitchen aspirin EC 325 MG tablet Take 325 mg by mouth every morning.    . Fish Oil-Cholecalciferol (FISH OIL + D3 PO) Take 1 capsule by mouth 2 (two) times daily.    Marland Kitchen levETIRAcetam (KEPPRA) 500 MG tablet Take 1 tablet (500 mg  total) by mouth 2 (two) times daily. 60 tablet 0  . metFORMIN (GLUCOPHAGE) 500 MG tablet Take 500 mg by mouth 2 (two) times daily with a meal.    . metoprolol (LOPRESSOR) 100 MG tablet Take 100 mg by mouth 2 (two) times daily.    . naproxen (NAPROSYN) 500 MG tablet Take 500 mg by mouth 2 (two) times daily with a meal.    . oxyCODONE-acetaminophen (PERCOCET/ROXICET) 5-325 MG per tablet Take 1 tablet by mouth every 4 (four) hours as needed for severe pain.     No current facility-administered medications for this visit.     No Known Allergies   REVIEW OF SYSTEMS: (Positives checked otherwise negative)  CARDIOVASCULAR:   chest pain,   chest pressure,   palpitations,   shortness of breath when laying flat,   shortness of breath with exertion,   pain in feet when walking,   pain in feet when laying flat,  history of blood clot in veins (DVT),   history of phlebitis,   swelling in legs,   varicose veins  PULMONARY:   productive cough,   asthma,   wheezing  NEUROLOGIC:   weakness in arms or legs,   numbness in arms or legs,    difficulty speaking or slurred speech,   temporary loss of vision in one eye,   dizziness  HEMATOLOGIC:   bleeding problems,   problems with blood clotting too easily  MUSCULOSKEL:   joint pain,  joint swelling  GASTROINTEST:   vomiting blood,   blood in stool   GENITOURINARY:   burning with urination,   blood in urine  PSYCHIATRIC:   history of major depression  INTEGUMENTARY:   rashes,   ulcers  CONSTITUTIONAL:   fever,   chills    Physical Examination  Filed Vitals:   02/17/15 1524  BP: 136/84  Pulse: 58  Temp: 98.3 F (36.8 C)  TempSrc: Oral  Resp: 14  Height:  (1.702 m)  Weight: 150 lb (68.04 kg)  SpO2: 97%   Body mass index is 23.49 kg/(m^2).  General: A&O x 3,  WD, thin  Eyes: PERRLA, EOMI  Neck: Supple, no nuchal rigidity, no palpable LAD  Pulmonary: Sym exp, good air movt, CTAB, no rales, rhonchi, & wheezing  Cardiac: RRR, Nl S1, S2, no Murmurs, rubs or gallops  Vascular: Vessel Right Left  Radial Palpable Palpable  Brachial Palpable Palpable  Carotid Palpable, without bruit Palpable, without bruit  Aorta Not palpable N/A  Femoral Palpable Palpable  Popliteal Not palpable Not palpable  PT Palpable Palpable  DP Palpable Palpable   Gastrointestinal: soft, NTND, no G/R, no HSM, no masses, no CVAT B  Musculoskeletal: M/S 5/5 throughout , Extremities without ischemic changes   Neurologic: CN 2-12 intact , Pain and light touch intact in extremities , Motor exam as listed above   Medical Decision Making  Ricky Sherman is a 64 y.o. male who presents with: asx R ICA stenosis >80%, distal h/o L CVA, L ICA stenosis <70%   Reportedly, the patient's nuclear stress test was negative.  His cardiologist' office is closed so we can't get the results.  Based on the patient's vascular studies and examination, I have offered the patient: R CEA, tenatively scheduled for 22 AUG 16.  I discussed with the patient the risks, benefits, and alternatives to carotid endarterectomy.    I discussed the procedural details of carotid endarterectomy with the patient.    The patient is aware that the risks of carotid endarterectomy include but are not limited to: bleeding, infection, stroke, myocardial infarction, death, cranial nerve injuries both temporary and permanent, neck hematoma, possible airway compromise, labile blood pressure post-operatively, cerebral hyperperfusion syndrome, and possible need for additional interventions in the future.   The patient is aware of the risks and agrees to proceed forward with the procedure.  If the nuclear stress testing is positive, may need to consider R CAS.  I discussed in depth with the patient the nature of  atherosclerosis, and emphasized the importance of maximal medical management including strict control of blood pressure, blood glucose, and lipid levels, antiplatelet agents, obtaining regular exercise, and cessation of smoking.    The patient is aware that without maximal medical management the underlying atherosclerotic disease process will progress, limiting the benefit of any interventions. The patient is currently not on a statin: as not medically indicated. The patient is currently on an anti-platelet: ASA.  Thank you for allowing [place] to participate in this patient's care.   Leonides Sake, MD [facility] Office: [phone] Pager: [phone]  02/17/2015, 5:04 PM

## 2015-03-06 NOTE — Progress Notes (Signed)
      Alert and oriented No tongue deviation no swallowing difficulty Sitting up in bed eating Incision clean and dry without hematoma  S/P right CEA Plan D/C in am  COLLINS, EMMA MAUREEN PA-C

## 2015-03-06 NOTE — Interval H&P Note (Signed)
History and Physical Interval Note:  03/06/2015 7:18 AM  Ricky Sherman  has presented today for surgery, with the diagnosis of Right carotid artery stenosis I65.21  The various methods of treatment have been discussed with the patient and family. After consideration of risks, benefits and other options for treatment, the patient has consented to  Procedure(s): ENDARTERECTOMY CAROTID (Right) as a surgical intervention .  The patient's history has been reviewed, patient examined, no change in status, stable for surgery.  I have reviewed the patient's chart and labs.  Questions were answered to the patient's satisfaction.     Leonides Sake

## 2015-03-06 NOTE — Transfer of Care (Signed)
Immediate Anesthesia Transfer of Care Note  Patient: Ricky Sherman  Procedure(s) Performed: Procedure(s): RIGHT CAROTID ENDARTERECTOMY (Right) PATCH ANGIOPLASTY USING XENOSURE BIOLOGIC  PATCH 1cm x 6cm (Right)  Patient Location: PACU  Anesthesia Type:General  Level of Consciousness: awake, alert  and oriented  Airway & Oxygen Therapy: Patient Spontanous Breathing and Patient connected to nasal cannula oxygen  Post-op Assessment: Report given to RN and Post -op Vital signs reviewed and stable  Post vital signs: Reviewed and stable  Last Vitals:  Filed Vitals:   03/06/15 0545  BP: 155/87  Pulse: 63  Temp: 36.5 C  Resp: 18    Complications: No apparent anesthesia complications   Pt is a/o and continues to move all extremities to command. Denies pain. No hematoma at surgical site.

## 2015-03-07 ENCOUNTER — Encounter (HOSPITAL_COMMUNITY): Payer: Self-pay | Admitting: Vascular Surgery

## 2015-03-07 LAB — BASIC METABOLIC PANEL
Anion gap: 5 (ref 5–15)
CHLORIDE: 107 mmol/L (ref 101–111)
CO2: 26 mmol/L (ref 22–32)
CREATININE: 0.62 mg/dL (ref 0.61–1.24)
Calcium: 8.3 mg/dL — ABNORMAL LOW (ref 8.9–10.3)
GFR calc Af Amer: >60 mL/min (ref >60)
GFR calc non Af Amer: >60 mL/min (ref >60)
GLUCOSE: 106 mg/dL — AB (ref 65–99)
POTASSIUM: 4 mmol/L (ref 3.5–5.1)
Sodium: 138 mmol/L (ref 135–145)

## 2015-03-07 LAB — CBC
HEMATOCRIT: 36.7 % — AB (ref 39.0–52.0)
HEMOGLOBIN: 12.9 g/dL — AB (ref 13.0–17.0)
MCH: 32.3 pg (ref 26.0–34.0)
MCHC: 35.1 g/dL (ref 30.0–36.0)
MCV: 91.8 fL (ref 78.0–100.0)
Platelets: 173 10*3/uL (ref 150–400)
RBC: 4 MIL/uL — AB (ref 4.22–5.81)
RDW: 14.1 % (ref 11.5–15.5)
WBC: 8.5 10*3/uL (ref 4.0–10.5)

## 2015-03-07 LAB — GLUCOSE, CAPILLARY
GLUCOSE-CAPILLARY: 110 mg/dL — AB (ref 65–99)
Glucose-Capillary: 85 mg/dL (ref 65–99)

## 2015-03-07 MED ORDER — LOVASTATIN 10 MG PO TABS: 10.0000 mg | ORAL_TABLET | Freq: Every day | ORAL | Status: DC

## 2015-03-07 MED ORDER — OXYCODONE-ACETAMINOPHEN 5-325 MG PO TABS: 1.0000 | ORAL_TABLET | Freq: Four times a day (QID) | ORAL | Status: DC | PRN

## 2015-03-07 NOTE — Progress Notes (Signed)
   Daily Progress Note   Assessment/Planning: POD #1 s/p R CEA   BP a little soft but normalizing now that patient is awake  Ok to D/C home if new few BP remain normotensive  Subjective  - 1 Day Post-Op  No complaints  Objective Filed Vitals:   03/06/15 1920 03/06/15 2341 03/06/15 2345 03/07/15 0330  BP: 101/67  92/41 138/62  Pulse: 66  58 78  Temp:  97.8 F (36.6 C)  98.2 F (36.8 C)  TempSrc:  Oral  Oral  Resp: Weight:      SpO2: 92%  93% 92%    Intake/Output Summary (Last 24 hours) at 03/07/15 0745 Last data filed at 03/07/15 0700  Gross per 24 hour  Intake   2585 ml  Output   1495 ml  Net   1090 ml    PULM  CTAB CV  RRR GI  soft, NTND NEURO CN intact, Motor sym, midline tongue NECK R incision c/d/i, no hematoma  Laboratory CBC    Component Value Date/Time   WBC 8.5 03/07/2015 0340   HGB 12.9* 03/07/2015 0340   HCT 36.7* 03/07/2015 0340   PLT 173 03/07/2015 0340    BMET    Component Value Date/Time   NA 138 03/07/2015 0340   K 4.0 03/07/2015 0340   CL 107 03/07/2015 0340   CO2 26 03/07/2015 0340   GLUCOSE 106* 03/07/2015 0340   BUN <5* 03/07/2015 0340   CREATININE 0.62 03/07/2015 0340   CALCIUM 8.3* 03/07/2015 0340   GFRNONAA >60 03/07/2015 0340   GFRAA >60 03/07/2015 0340    Leonides Sake, MD [facility] Office: [phone] Pager: [phone]  03/07/2015, 7:45 AM

## 2015-03-09 ENCOUNTER — Telehealth: Payer: Self-pay | Admitting: Vascular Surgery

## 2015-03-09 NOTE — Telephone Encounter (Signed)
Spoke with pts daughter, dpm °

## 2015-03-09 NOTE — Telephone Encounter (Signed)
-----   Message from Sharee Pimple, RN sent at 03/07/2015  2:11 PM EDT ----- Regarding: Schedule   ----- Message -----    From: Raymond Gurney, PA-C    Sent: 03/07/2015   7:52 AM      To: [facility] Charge Pool  S/p right CEA 03/06/15  F/u with [facility] in 2 weeks  Thanks Selena Batten

## 2015-03-09 NOTE — Telephone Encounter (Signed)
-----   Message from Sharee Pimple, RN sent at 03/06/2015  3:01 PM EDT ----- Regarding: Schedule   ----- Message -----    From: Fransisco Hertz, MD    Sent: 03/06/2015  12:28 PM      To: [facility] Charge Pool  Jodi Kappes [phone] 07/25/1950  PROCEDURE:  1. right carotid endarterectomy with bovine patch angioplasty 2. right intraoperative carotid ultrasound  Asst: Karsten Ro, [facility]   Follow-up: 2 weeks

## 2015-03-09 NOTE — Discharge Summary (Signed)
[facility] Discharge Summary  Ricky Sherman [birth year] 64 y.o. male  [id number]  Admission Date: 03/06/2015  Discharge Date: 03/07/2015  Physician: Leonides Sake, MD  Admission Diagnosis: Right carotid artery stenosis I65.21  HPI:   This is a 64 y.o. male who presented with chief complaint: cardiology follow up. Previous carotid studies demonstrated: RICA 80-99% stenosis, LICA 40-59% stenosis. Patient has distant history of TIA or stroke symptoms. The patient has never had amaurosis fugax or monocular blindness. 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 The patient has never had receptive or expressive aphasia.The patient's previous neurologic deficits have resolved.   Hospital Course:  The patient was admitted to the hospital and taken to the operating room on 03/06/2015 and underwent right carotid endarterectomy.  The patient tolerated the procedure well and was transported to the PACU in stable condition.  By POD 1, the patient's neuro status was intact. His right neck incision was clean, dry and intact without hematoma. His blood pressure was a little soft overnight but was normalizing. He had ambulated, voided and tolerated a diet without difficulty. His blood pressure was 153/83 at discharge. He was discharged home on POD 1 in good condition.       Recent Labs  03/07/15 0340  NA 138  K 4.0  CL 107  CO2 26  GLUCOSE 106*  BUN <5*  CALCIUM 8.3*    Recent Labs  03/06/15 2000 03/07/15 0340  WBC 9.3 8.5  HGB 13.6 12.9*  HCT 37.4* 36.7*  PLT 167 173   No results for input(s): INR in the last 72 hours.  Discharge Instructions:   The patient is discharged to home with extensive instructions on wound care and progressive ambulation.  They are instructed not to drive or perform any heavy lifting until returning to see the physician in his office.  Discharge Instructions    CAROTID  Sugery: Call MD for difficulty swallowing or speaking; weakness in arms or legs that is a new symtom; severe headache.  If you have increased swelling in the neck and/or  are having difficulty breathing, CALL [phone]    Complete by:  As directed      Call MD for:  redness, tenderness, or signs of infection (pain, swelling, bleeding, redness, odor or green/yellow discharge around incision site)    Complete by:  As directed      Call MD for:  severe or increased pain, loss or decreased feeling  in affected limb(s)    Complete by:  As directed      Call MD for:  temperature >100.5    Complete by:  As directed      Discharge wound care:    Complete by:  As directed   Wash wound daily with soap and water and pat dry. You do not have to apply a dressing     Driving Restrictions    Complete by:  As directed   No driving for 2 weeks     Increase activity slowly    Complete by:  As directed   Walk with assistance use walker or cane as needed     Lifting restrictions    Complete by:  As directed   No lifting for 2 weeks     Resume previous diet    Complete by:  As directed            Discharge Diagnosis:  Right carotid artery stenosis I65.21  Secondary Diagnosis: Patient Active Problem List   Diagnosis Date Noted  . Asymptomatic carotid artery stenosis 03/06/2015  . Seizure disorder 01/27/2015  . CVA (cerebral infarction) 01/27/2015  . Carotid stenosis 01/27/2015   Past Medical History  Diagnosis Date  . Hypertension   . DDD (degenerative disc disease), lumbar   . DDD (degenerative disc disease), cervical   . Headache   . Weakness   . Seizure disorder Jan and December 29, 2014  . Complication of anesthesia     pt states he woke up during colonoscopy  . Stroke     right sided weakness.2002  . Carotid artery occlusion   . Peripheral vascular disease of lower extremity     Right Leg  . Shortness of breath dyspnea     frequently; with exertion; PCP is aware; reason for cardiology  clearance prior to surgery  . Diabetes mellitus without complication     Type 2  . Urinary urgency   . Nocturia   . GERD (gastroesophageal reflux disease)   . Seizures     June 16th Last seizure, Also in January  . Legally blind     BIL  . Glaucoma     BIL  . Cataract     surgery L eye; currently in R eye      Medication List    TAKE these medications        amLODipine 5 MG tablet  Commonly known as:  NORVASC  Take 5 mg by mouth daily.     aspirin EC 325 MG tablet  Take 325 mg by mouth every morning.     Fish Oil 1000 MG Caps  Take 1,000 mg by mouth 2 (two) times daily.     levETIRAcetam 500 MG tablet  Commonly known as:  KEPPRA  Take 1 tablet (500 mg total) by mouth 2 (two) times daily.     lovastatin 10 MG tablet  Commonly known as:  MEVACOR  Take 1 tablet (10 mg total) by mouth at bedtime.     metFORMIN 500 MG tablet  Commonly known as:  GLUCOPHAGE  Take 500 mg by mouth 2 (two) times daily with a meal.     metoprolol 100 MG tablet  Commonly known as:  LOPRESSOR  Take 100 mg by mouth 2 (two) times daily.     multivitamin tablet  Take 1 tablet by mouth daily.     naproxen 500 MG tablet  Commonly known as:  NAPROSYN  Take 500 mg by mouth 2 (two) times daily as needed for moderate pain.     omeprazole 20 MG capsule  Commonly known as:  PRILOSEC  Take 20 mg by mouth as needed.     oxyCODONE-acetaminophen 5-325 MG per tablet  Commonly known as:  PERCOCET/ROXICET  Take 1 tablet by mouth every 6 (six) hours as needed for severe pain.     Vitamin D (Cholecalciferol) 400 UNITS Caps  Take 400 Units by mouth daily.        Percocet #20 No Refill  Disposition: Home  Patient's condition: is Good  Follow up: 1. Dr. Imogene Burn in 2 weeks.   Maris Berger, PA-C [facility] [phone]  Addendum  I have independently interviewed and examined the patient, and I agree with the physician assistant's discharge summary.  This patient  underwent an uneventful R CEA.   His post-operative course was also uneventful and remained neurologically intact without any evidence of hematoma post-operatively.  The patient will follow up  in the office in 2 weeks for a wound check.  Leonides Sake, MD [facility] [place] Office: [phone] Pager: [phone]  03/10/2015, 8:37 AM    --- For VQI Registry use --- Instructions: Press F2 to tab through selections.  Delete question if not applicable.   Modified Rankin score at D/C (0-6): 0  IV medication needed for:  1. Hypertension: No 2. Hypotension: No  Post-op Complications: No  1. Post-op CVA or TIA: No  2. CN injury: No  3. Myocardial infarction: No  4.  CHF: No  5.  Dysrhythmia (new): No  6. Wound infection: No  7. Reperfusion symptoms: No  8. Return to OR: No  Discharge medications: Statin use:  Yes If No:  For Medical reasons,  Non-compliant,  Not-indicated ASA use:  Yes  If No:  For Medical reasons,  Non-compliant,  Not-indicated Beta blocker use:  Yes If No:  For Medical reasons,  Non-compliant,  Not-indicated ACE-Inhibitor use:  No If No:  For Medical reasons,  Non-compliant, [x ] Not-indicated P2Y12 Antagonist use: No,  Plavix,  Plasugrel,  Ticlopinine,  Ticagrelor,  Other,  No for medical reason,  Non-compliant, [x ] Not-indicated Anti-coagulant use:  No,  Warfarin,  Rivaroxaban,  Dabigatran,  Other,  No for medical reason,  Non-compliant, [x ] Not-indicated

## 2015-03-09 NOTE — Telephone Encounter (Signed)
Spoke with pts daughter to schedule, dpm °

## 2015-03-13 ENCOUNTER — Telehealth: Payer: Self-pay

## 2015-03-13 DIAGNOSIS — G8918 Other acute postprocedural pain: Secondary | ICD-10-CM

## 2015-03-13 MED ORDER — OXYCODONE-ACETAMINOPHEN 5-325 MG PO TABS: 1.0000 | ORAL_TABLET | Freq: Four times a day (QID) | ORAL | Status: AC | PRN

## 2015-03-13 NOTE — Telephone Encounter (Signed)
Phone call back to pt's. daughter.  Stated the pt. c/o "constant pain" at lower end of right neck incision.  Stated there is no redness or increased swelling of incisional area.   Denied pt. having any difficulty breathing, or with swallowing.  Denied headache.  Denied any stroke symptoms.  Stated pt. is completely out of the Percocet 5/325 mg.  Advised will discuss with MD in office.

## 2015-03-13 NOTE — Telephone Encounter (Signed)
Discussed pt's c/o pain with Dr. Myra Gianotti.   Gave verbal okay to order Oxycodone/Acetaminophe 5/325 mg, 1 tab po, q 6 hr. Prn, # 20; no refills.  Left voice message for pt's daughter that an Rx will be avail. to pick up at the front desk.

## 2015-03-24 ENCOUNTER — Encounter: Payer: Medicare Other | Admitting: Vascular Surgery

## 2015-03-27 ENCOUNTER — Encounter: Payer: Self-pay | Admitting: Vascular Surgery

## 2015-03-28 ENCOUNTER — Encounter: Payer: Self-pay | Admitting: Vascular Surgery

## 2015-03-28 ENCOUNTER — Ambulatory Visit (INDEPENDENT_AMBULATORY_CARE_PROVIDER_SITE_OTHER): Payer: Self-pay | Admitting: Vascular Surgery

## 2015-03-28 VITALS — BP 151/75 | HR 59 | Temp 98.3°F | Ht 67.0 in | Wt 149.9 lb

## 2015-03-28 DIAGNOSIS — I6523 Occlusion and stenosis of bilateral carotid arteries: Secondary | ICD-10-CM

## 2015-03-28 NOTE — Addendum Note (Signed)
Addended by: Adria Dill L on: 03/28/2015 03:12 PM   Modules accepted: Orders

## 2015-03-28 NOTE — Progress Notes (Signed)
Postoperative Visit   History of Present Illness  Ricky Sherman is a 64 y.o. male who presents for postoperative follow-up for: R CEA (Date: 03/06/15).  The patient's neck incision is healed.  The patient has had no stroke or TIA symptoms.  For VQI Use Only  PRE-ADM LIVING: Home  AMB STATUS: Ambulatory  Social History   Social History  . Marital Status: Widowed    Spouse Name: Ricky Sherman  . Number of Children: Ricky Sherman  . Years of Education: Ricky Sherman   Occupational History  . Not on file.   Social History Main Topics  . Smoking status: Heavy Tobacco Smoker -- 0.50 packs/day for 15 years    Types: Cigarettes  . Smokeless tobacco: Never Used  . Alcohol Use: No  . Drug Use: No  . Sexual Activity: Not on file   Other Topics Concern  . Not on file   Social History Narrative    Current Outpatient Prescriptions on File Prior to Visit  Medication Sig Dispense Refill  . amLODipine (NORVASC) 5 MG tablet Take 5 mg by mouth daily.    Marland Kitchen aspirin EC 325 MG tablet Take 325 mg by mouth every morning.    . levETIRAcetam (KEPPRA) 500 MG tablet Take 1 tablet (500 mg total) by mouth 2 (two) times daily. 60 tablet 0  . lovastatin (MEVACOR) 10 MG tablet Take 1 tablet (10 mg total) by mouth at bedtime. 30 tablet 11  . metFORMIN (GLUCOPHAGE) 500 MG tablet Take 500 mg by mouth 2 (two) times daily with a meal.    . metoprolol (LOPRESSOR) 100 MG tablet Take 100 mg by mouth 2 (two) times daily.    . Multiple Vitamin (MULTIVITAMIN) tablet Take 1 tablet by mouth daily.    . naproxen (NAPROSYN) 500 MG tablet Take 500 mg by mouth 2 (two) times daily as needed for moderate pain.     . Omega-3 Fatty Acids (FISH OIL) 1000 MG CAPS Take 1,000 mg by mouth 2 (two) times daily.    Marland Kitchen omeprazole (PRILOSEC) 20 MG capsule Take 20 mg by mouth as needed.    Marland Kitchen oxyCODONE-acetaminophen (PERCOCET/ROXICET) 5-325 MG per tablet Take 1 tablet by mouth every 6 (six) hours as needed for severe pain. 20 tablet 0  . Vitamin D,  Cholecalciferol, 400 UNITS CAPS Take 400 Units by mouth daily.     No current facility-administered medications on file prior to visit.    Physical Examination  Filed Vitals:   03/28/15 1122  BP: 151/75  Pulse:   Temp:     R Neck: Incision is healed Neuro: CN 2-12 are intact , Motor strength is 5/5 bilaterally, sensation is grossly intact  Medical Decision Making  Ricky Sherman is a 64 y.o. male who presents s/p R CEA, asx L ICA stenosis 40-59%  The patient's neck incision is healing with no stroke symptoms. I discussed in depth with the patient the nature of atherosclerosis, and emphasized the importance of maximal medical management including strict control of blood pressure, blood glucose, and lipid levels, obtaining regular exercise, anti-platelet use and cessation of smoking.   The patient is currently on an antiplatelet: ASA. The patient is currently on a statin: Mevacor. The patient is aware that without maximal medical management the underlying atherosclerotic disease process will progress, limiting the benefit of any interventions. The patient's surveillance will included routine carotid duplex studies which will be completed in: 9 months, at which time the patient will be re-evaluated.   I emphasized the  importance of routine surveillance of the carotid arteries as recurrence of stenosis is possible, especially with proper management of underlying atherosclerotic disease. The patient agrees to participate in their maximal medical care and routine surveillance.  Thank you for allowing [place] to participate in this patient's care.  Adele Barthel, MD [facility] [place] Office: [phone] Pager: [phone]

## 2015-08-07 IMAGING — CT CT HEAD W/O CM
1 of 2 series · 16 of 30 positions shown, 20 images · non-contrast
Comparison: None; correlation MR head 12/13/2014

CLINICAL DATA: Witnessed seizure at 0782 hours ; history diabetes,
hypertension, seizures, stroke, smoking

EXAM:
CT HEAD WITHOUT CONTRAST
TECHNIQUE: Contiguous axial images were obtained from the base of the skull
through the vertex without intravenous contrast.

[Series 3: headtrauma 2.4 h60s · axial · 0.49mm/px · z∈[+1142,+1299]mm · 16 of 72 slices shown, 20 images]
[im 4/72  brain]
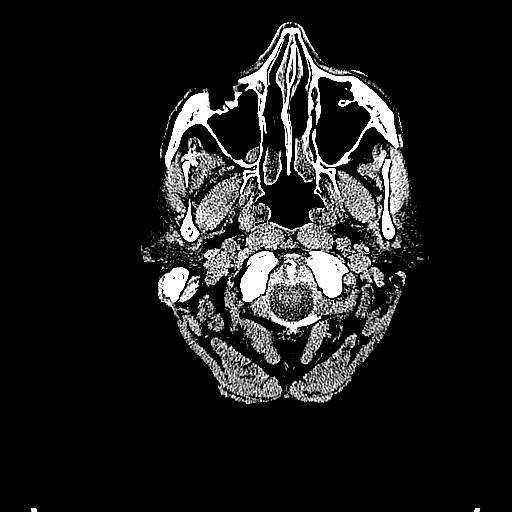
[im 4/72  bone]
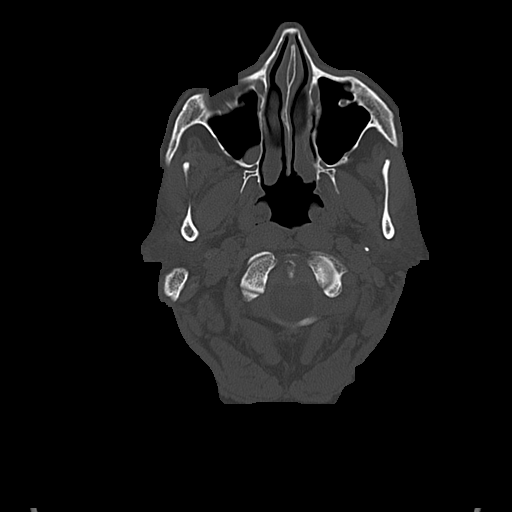
[im 8/72  brain]
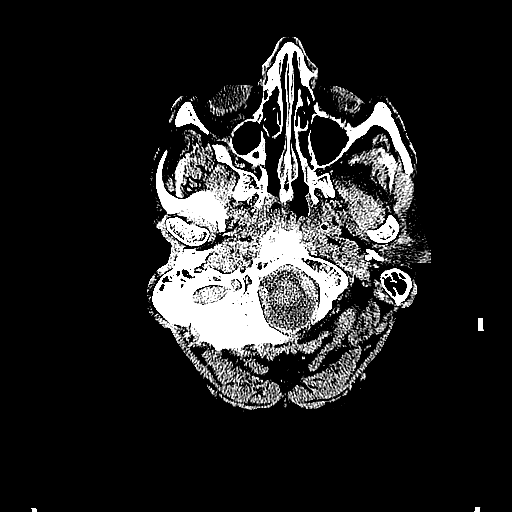
[im 12/72  brain]
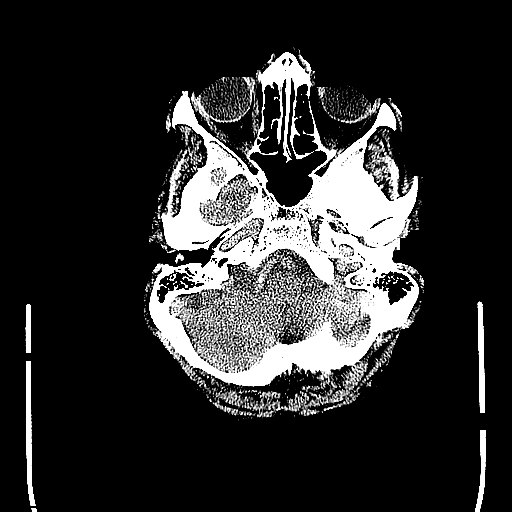
[im 15/72  brain]
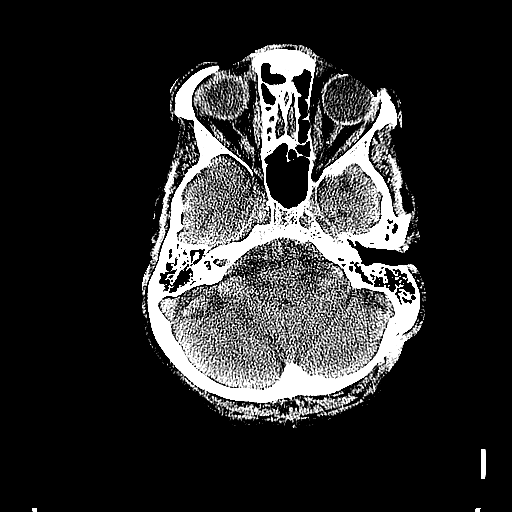
[im 23/72  brain]
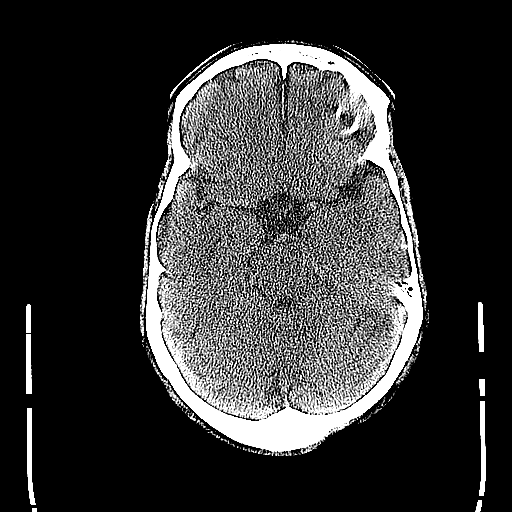
[im 23/72  bone]
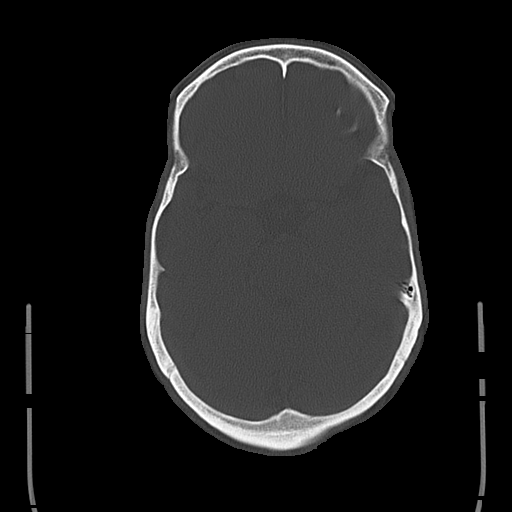
[im 27/72  brain]
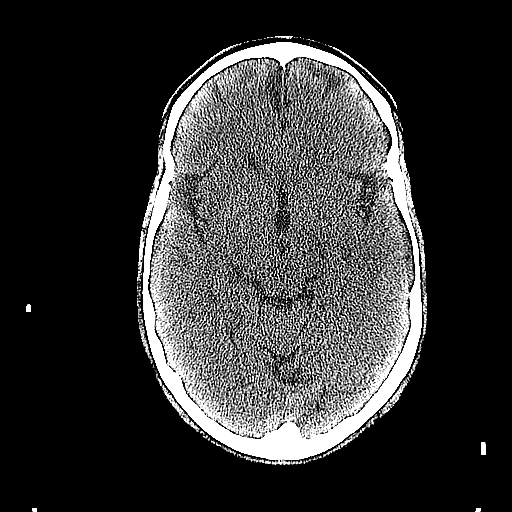
[im 30/72  brain]
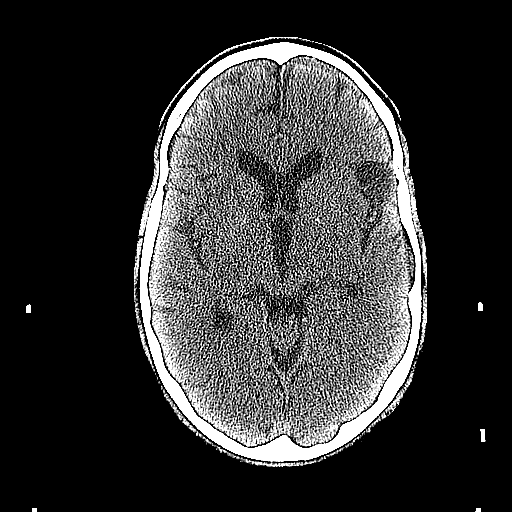
[im 34/72  brain]
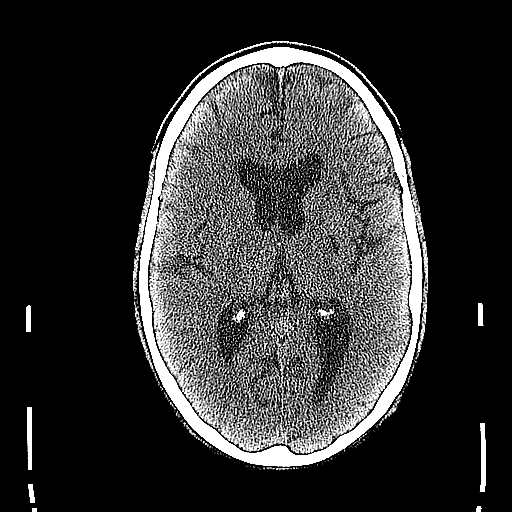
[im 38/72  brain]
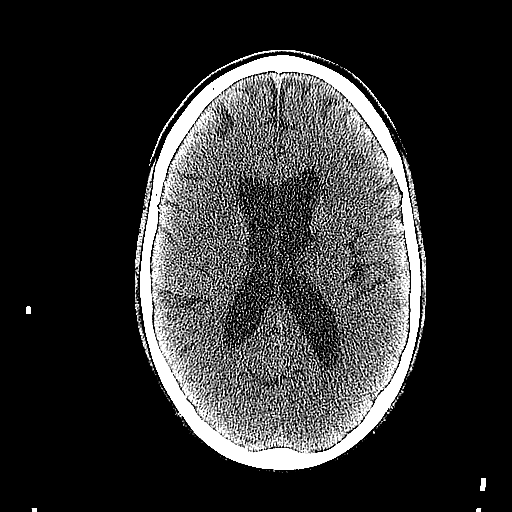
[im 38/72  bone]
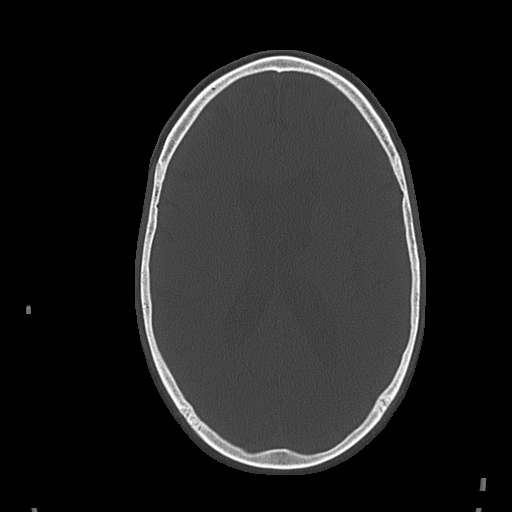
[im 42/72  brain]
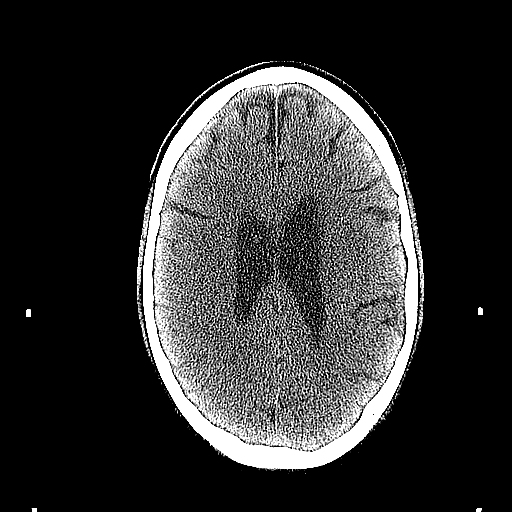
[im 45/72  brain]
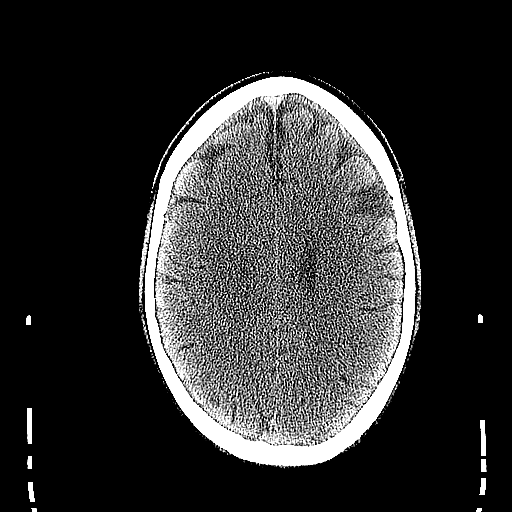
[im 49/72  brain]
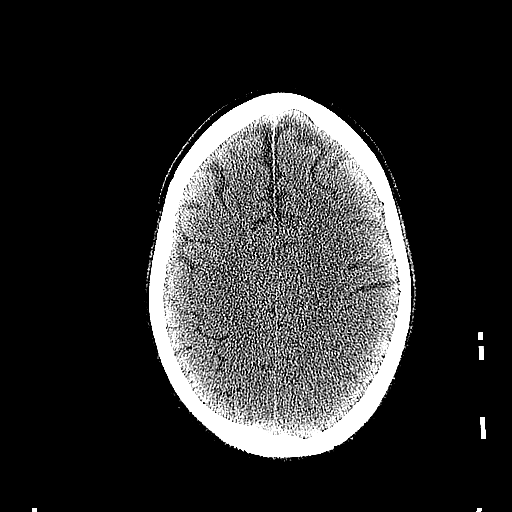
[im 57/72  brain]
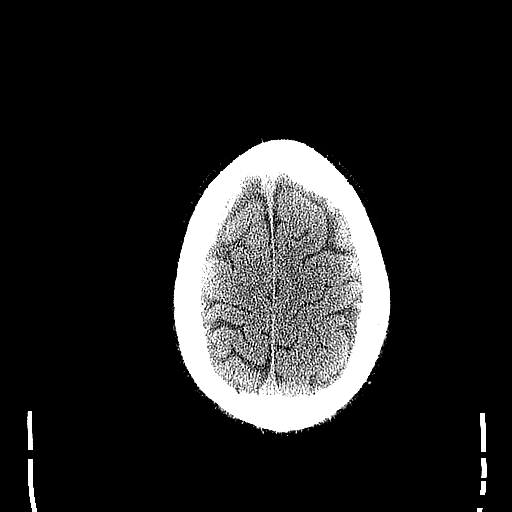
[im 57/72  bone]
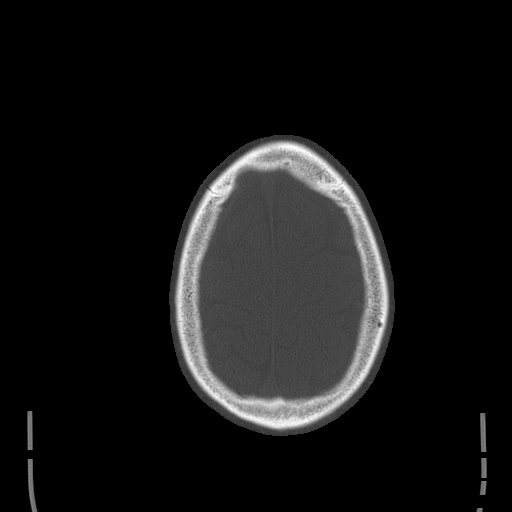
[im 60/72  brain]
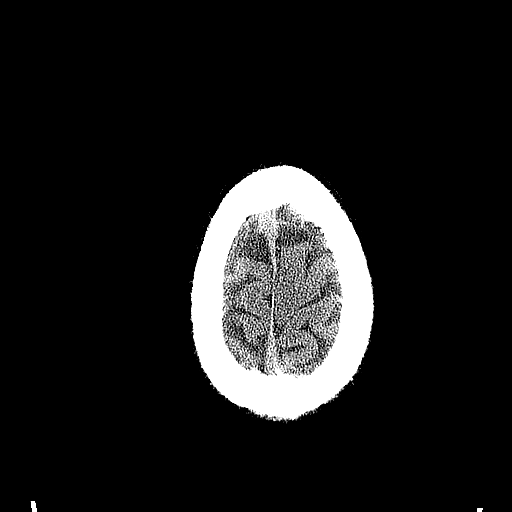
[im 64/72  brain]
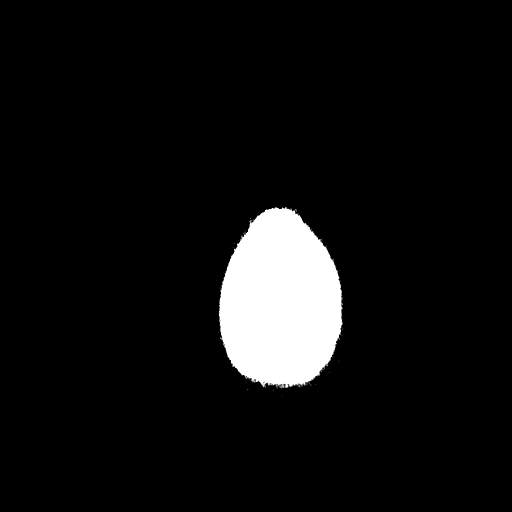
[im 68/72  brain]
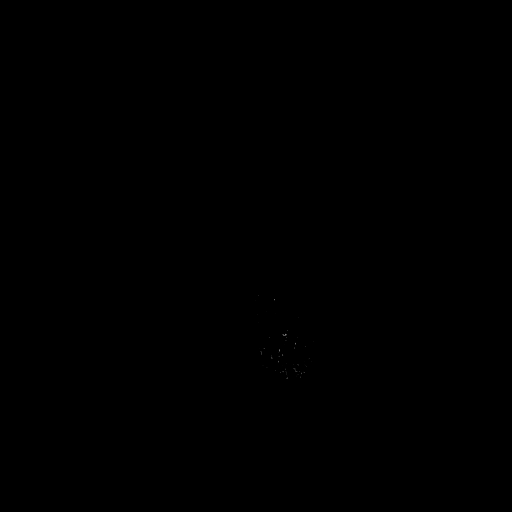

[16 of 30 positions shown; findings below may reference images not displayed]

FINDINGS: Mild generalized atrophy.

Normal ventricular morphology.

No midline shift or mass effect.

Old lacunar infarct LEFT basal ganglia.

No intracranial hemorrhage, mass lesion or evidence acute
infarction.

No extra-axial fluid collections.

Atherosclerotic calcifications at skullbase.

Bones and sinuses unremarkable.
IMPRESSION: Atrophy with old lacunar infarct at LEFT basal ganglia.

No acute intracranial abnormalities.

## 2015-09-19 ENCOUNTER — Other Ambulatory Visit: Payer: Self-pay

## 2015-09-19 MED ORDER — LOVASTATIN 10 MG PO TABS: 10.0000 mg | ORAL_TABLET | Freq: Every day | ORAL | Status: DC

## 2015-12-25 ENCOUNTER — Encounter: Payer: Self-pay | Admitting: Vascular Surgery

## 2015-12-29 ENCOUNTER — Ambulatory Visit (HOSPITAL_COMMUNITY): Payer: Medicare Other

## 2015-12-29 ENCOUNTER — Encounter: Payer: Medicare Other | Admitting: Vascular Surgery

## 2015-12-29 NOTE — Progress Notes (Deleted)
Established Carotid Patient  History of Present Illness  Ricky Sherman is a 65 y.o. (1951-03-09) male who presents with chief complaint: ***.  S/p R CEA (Date: 03/06/15) for asx R ICA stenosis >80%.    Previous carotid studies demonstrated: RICA 80-99% stenosis, LICA 40-59% stenosis.  Patient has distant history of TIA or stroke symptom.  The patient has never had amaurosis fugax or monocular blindness.  The patient has never had facial drooping or hemiplegia.  The patient has never had receptive or expressive aphasia.  The patient's previous neurologic deficits have resolved.  The patient's PMH, PSH, SH, and FamHx are unchanged from 03/28/15.  Current Outpatient Prescriptions  Medication Sig Dispense Refill  . amLODipine (NORVASC) 5 MG tablet Take 5 mg by mouth daily.    Marland Kitchen aspirin EC 325 MG tablet Take 325 mg by mouth every morning.    . levETIRAcetam (KEPPRA) 500 MG tablet Take 1 tablet (500 mg total) by mouth 2 (two) times daily. 60 tablet 0  . lovastatin (MEVACOR) 10 MG tablet Take 1 tablet (10 mg total) by mouth at bedtime. 90 tablet 11  . metFORMIN (GLUCOPHAGE) 500 MG tablet Take 500 mg by mouth 2 (two) times daily with a meal.    . metoprolol (LOPRESSOR) 100 MG tablet Take 100 mg by mouth 2 (two) times daily.    . Multiple Vitamin (MULTIVITAMIN) tablet Take 1 tablet by mouth daily.    . naproxen (NAPROSYN) 500 MG tablet Take 500 mg by mouth 2 (two) times daily as needed for moderate pain.     . Omega-3 Fatty Acids (FISH OIL) 1000 MG CAPS Take 1,000 mg by mouth 2 (two) times daily.    Marland Kitchen omeprazole (PRILOSEC) 20 MG capsule Take 20 mg by mouth as needed.    Marland Kitchen oxyCODONE-acetaminophen (PERCOCET/ROXICET) 5-325 MG per tablet Take 1 tablet by mouth every 6 (six) hours as needed for severe pain. 20 tablet 0  . Vitamin D, Cholecalciferol, 400 UNITS CAPS Take 400 Units by mouth daily.     No current facility-administered medications for this visit.    No Active Allergies  On ROS today:  ***, ***   Physical Examination  There were no vitals filed for this visit. There is no weight on file to calculate BMI.  General: A&O x 3, WD***, Obese, ***, Cachectic, ***, Ill appear, ***, Somulent,  Eyes: PERRLA, EOMI***, Post surg chg to pupils, ***, Unable to coop w/ exam,  Neck: Supple, *** nuchal rigidity, *** palpable LAD  Pulmonary: Sym exp, good air movt, CTAB, no rales, rhonchi, & wheezing***, + rales, ***, + rhonchi, ***, + wheezing,   Cardiac: RRR, Nl S1, S2, no Murmurs, rubs or gallops***, S3/S4, ***, Irregularly, irregular rhythm and rate,  Vascular: Vessel Right Left  Radial ***Palpable ***Palpable  Brachial ***Palpable ***Palpable  Carotid Palpable, with***out bruit Palpable, with***out bruit  Aorta Not palpable N/A  Femoral ***Palpable ***Palpable  Popliteal Not palpable Not palpable  PT ***Palpable ***Palpable  DP ***Palpable ***Palpable   Gastrointestinal: soft, NTND, no G/R, no HSM, no masses, no CVAT B***, + AAA , ***, Surg. Inc TTP: (RUQ / RLQ / LUQ / LLQ / Epigastric), ***, +G / +R,  Musculoskeletal: M/S 5/5 throughout *** except ***, Extremities without ischemic changes *** except  ***  Neurologic: CN 2-12 intact *** except ***, Pain and light touch intact in extremities *** except ***, Motor exam as listed above   Non-Invasive Vascular Imaging  CAROTID DUPLEX (Date: ***):   R  ICA stenosis: ***%  R VA: *** patent and antegrade  L ICA stenosis: ***%  L VA: *** patent and antegrade   Medical Decision Making  Ricky Sherman is a 65 y.o. male who presents with: s/p R CEA for asx ICA stenosis >80%, LICA stenosis ***%   Based on the patient's vascular studies and examination, I have offered the patient: ***.  I discussed in depth with the patient the nature of atherosclerosis, and emphasized the importance of maximal medical management including strict control of blood pressure, blood glucose, and lipid levels, antiplatelet agents, obtaining  regular exercise, and cessation of smoking.    The patient is aware that without maximal medical management the underlying atherosclerotic disease process will progress, limiting the benefit of any interventions. The patient is currently *** on a statin: ***.  ***The patient will be started on Lipitor 10 mg PO daily, to be titrated and managed by their primary care physician.   The patient is currently *** on an anti-platelet: ***.  ***The patient will be started on ASA 81 mg PO daily.  Thank you for allowing us to participate in this patient's care.   Leonides SakeBrian Kiwan Gadsden, MD, FACS [facility] [place] Office: [phone] Pager: [phone]  --- VASCULAR QUALITY INITIATIVE FOLLOW UP DATA:   Current smoker: [x]  yes  [  ] no  Living status: [x]   Home  [  ] Nursing home  [  ] Homeless    MEDS:  ASA [x]  yes  [  ] no- [  ] medical reason  [  ] non compliant  STATIN  [x]  yes  [  ] no- [  ] medical reason  [  ] non compliant  Beta blocker [x ] yes  [  ] no- [  ] medical reason  [  ] non compliant  ACE inhibitor [  ] yes  [x]  no- [  ] medical reason  [  ] non compliant  P2Y12 Antagonist [x]  none  [  ] clopidogrel-Plavix  [  ] ticlopidine-Ticlid   [  ] prasugrel-Effient  [  ] ticagrelor- Brilinta    Anticoagulant [x]  None  [  ] warfarin  [  ] rivaroxaban-Xarelto [  ] dabigatran- Pradaxa  Neurologic event since D/C:  [x]  no  [  ] yes: [  ] eye event  [  ] cortical event  [  ] VB event  [  ] non specific event  [  ] right  [  ] left  [  ] TIA  [  ] stroke  Date:   Modified Rankin Score: 0  MI since D/C: [x]  no  [  ] troponin only  [  ] EKG or clinical  Cranial nerve injury: [x]  none  [  ] resolved  [  ] persistent  Duplex CEA site: [  ] no  [x]  yes - PSV= ***  EDV= ***  ICA/CCA ratio: ***  Stenosis= [***] <40% [  ] 40-59% [  ] 60-79%  [  ] > 80%  [  ]  Occluded  CEA site re-operation:  [  ] no   [  ] yes- date of re-op:  CEA site PCI:   [  ] no   [  ] yes- date  of PCI:

## 2016-01-01 NOTE — Progress Notes (Signed)
This encounter was created in error - please disregard.

## 2016-03-05 ENCOUNTER — Encounter: Payer: Self-pay | Admitting: Vascular Surgery

## 2016-03-07 NOTE — Progress Notes (Deleted)
Established Carotid Patient  History of Present Illness  Ricky Sherman is a 65 y.o. ([birth year]) male who presents with chief complaint: ***.  Pt is s/p R CEA (03/06/15) for asx R ICA stenosis >80%.  Previous carotid studies demonstrated: RICA 80-99% stenosis, LICA 40-59% stenosis.  Patient has distant history of TIA or stroke symptoms.  The patient has never had amaurosis fugax or monocular blindness. 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 The patient has never had receptive or expressive aphasia. The patient's previous neurologic deficits have resolved   The patient's PMH, PSH, SH, and FamHx are unchanged from 03/28/15.  Current Outpatient Prescriptions  Medication Sig Dispense Refill  . amLODipine (NORVASC) 5 MG tablet Take 5 mg by mouth daily.    Marland Kitchen. aspirin EC 325 MG tablet Take 325 mg by mouth every morning.    . levETIRAcetam (KEPPRA) 500 MG tablet Take 1 tablet (500 mg total) by mouth 2 (two) times daily. 60 tablet 0  . lovastatin (MEVACOR) 10 MG tablet Take 1 tablet (10 mg total) by mouth at bedtime. 90 tablet 11  . metFORMIN (GLUCOPHAGE) 500 MG tablet Take 500 mg by mouth 2 (two) times daily with a meal.    . metoprolol (LOPRESSOR) 100 MG tablet Take 100 mg by mouth 2 (two) times daily.    . Multiple Vitamin (MULTIVITAMIN) tablet Take 1 tablet by mouth daily.    . naproxen (NAPROSYN) 500 MG tablet Take 500 mg by mouth 2 (two) times daily as needed for moderate pain.     . Omega-3 Fatty Acids (FISH OIL) 1000 MG CAPS Take 1,000 mg by mouth 2 (two) times daily.    Marland Kitchen. omeprazole (PRILOSEC) 20 MG capsule Take 20 mg by mouth as needed.    Marland Kitchen. oxyCODONE-acetaminophen (PERCOCET/ROXICET) 5-325 MG per tablet Take 1 tablet by mouth every 6 (six) hours as needed for severe pain. 20 tablet 0  . Vitamin D, Cholecalciferol, 400 UNITS CAPS Take 400 Units by mouth daily.     No current facility-administered medications for this visit.      No Active Allergies  On ROS today: ***, ***   Physical Examination  There were no vitals filed for this visit. There is no height or weight on file to calculate BMI.  General: A&O x 3, WD***, Obese, ***, Cachectic, ***, Ill appear, ***, Somulent,  Eyes: PERRLA, EOMI***, Post surg chg to pupils, ***, Unable to coop w/ exam,  Neck: Supple, *** nuchal rigidity, *** palpable LAD  Pulmonary: Sym exp, good air movt, CTAB, no rales, rhonchi, & wheezing***, + rales, ***, + rhonchi, ***, + wheezing,   Cardiac: RRR, Nl S1, S2, no Murmurs, rubs or gallops***, S3/S4, ***, Irregularly, irregular rhythm and rate,  Vascular: Vessel Right Left  Radial ***Palpable ***Palpable  Brachial ***Palpable ***Palpable  Carotid Palpable, with***out bruit Palpable, with***out bruit  Aorta Not palpable N/A  Femoral ***Palpable ***Palpable  Popliteal Not palpable Not palpable  PT ***Palpable ***Palpable  DP ***Palpable ***Palpable   Gastrointestinal: soft, NTND, no G/R, no HSM, no masses, no CVAT B***, + AAA , ***, Surg. Inc TTP: (RUQ / RLQ / LUQ / LLQ / Epigastric), ***, +G / +R,  Musculoskeletal: M/S 5/5 throughout *** except ***, Extremities without ischemic changes *** except  ***  Neurologic: CN 2-12 intact *** except ***, Pain and light touch intact in extremities *** except ***, Motor exam as listed above   Non-Invasive Vascular  Imaging  CAROTID DUPLEX (Date: ***):   R ICA stenosis: ***%  R VA: *** patent and antegrade  L ICA stenosis: ***%  L VA: *** patent and antegrade   Medical Decision Making  Ricky Sherman is a 65 y.o. male who presents with: s/p R CEA for asx ICA stenosis >80%, distal L sided CVA, L ICA stenosis 40-59%   Based on the patient's vascular studies and examination, I have offered the patient: annual bilateral carotid duplex..  I discussed in depth with the patient the nature of atherosclerosis, and emphasized the importance of maximal medical management  including strict control of blood pressure, blood glucose, and lipid levels, antiplatelet agents, obtaining regular exercise, and cessation of smoking.    The patient is aware that without maximal medical management the underlying atherosclerotic disease process will progress, limiting the benefit of any interventions. The patient is currently on a statin: Mevacor. The patient is currently on an anti-platelet: ASA.  Thank you for allowing us to participate in this patient's care.   Leonides SakeBrian Iliany Losier, MD, FACS [facility] Office: [phone] Pager: [phone]   VASCULAR QUALITY INITIATIVE FOLLOW UP DATA:  Current smoker: [x  ] yes  [  ] no  Living status: [ x ]  Home  [  ] Nursing home  [  ] Homeless    MEDS:  ASA [  x] yes  [  ] no- [  ] medical reason  [  ] non compliant  STATIN  [x]  yes  [  ] no- [  ] medical reason  [  ] non compliant  Beta blocker [x]  yes  [  ] no- [  ] medical reason  [  ] non compliant  ACE inhibitor [  ] yes  [x]  no- [  ] medical reason  [  ] non compliant  P2Y12 Antagonist [  ] none  [x]  clopidogrel-Plavix  [  ] ticlopidine-Ticlid   [  ] prasugrel-Effient  [  ] ticagrelor- Brilinta    Anticoagulant [x]  None  [  ] warfarin  [  ] rivaroxaban-Xarelto [  ] dabigatran- Pradaxa  Neurologic event since D/C:  [x]  no  [  ] yes: [  ] eye event  [  ] cortical event  [  ] VB event  [  ] non specific event  [  ] right  [  ] left  [  ] TIA  [  ] stroke  Date:   Modified Rankin Score: 0  MI since D/C: [x]  no  [  ] troponin only  [  ] EKG or clinical  Cranial nerve injury: [ x] none  [  ] resolved  [  ] persistent  Duplex CEA site: [  ] no  [  ] yes - PSV= ***  EDV= ***  ICA/CCA ratio: ***  Stenosis= [  ] <40% [  ] 40-59% [  ] 60-79%  [  ] > 80%  [  ]  Occluded  CEA site re-operation:  [x]  no   [  ] yes- date of re-op:  CEA site PCI:   [x]  no   [  ] yes- date of PCI:

## 2016-03-08 ENCOUNTER — Ambulatory Visit: Payer: Medicare Other | Admitting: Vascular Surgery

## 2016-03-08 ENCOUNTER — Ambulatory Visit (HOSPITAL_COMMUNITY): Admission: RE | Admit: 2016-03-08 | Payer: Medicare Other | Source: Ambulatory Visit

## 2016-04-22 ENCOUNTER — Encounter (HOSPITAL_COMMUNITY): Payer: Medicare Other

## 2016-04-22 ENCOUNTER — Ambulatory Visit: Payer: Medicare Other | Admitting: Vascular Surgery

## 2016-04-26 ENCOUNTER — Encounter (HOSPITAL_COMMUNITY): Payer: Medicare Other

## 2016-04-26 ENCOUNTER — Ambulatory Visit: Payer: Medicare Other | Admitting: Vascular Surgery

## 2016-04-29 ENCOUNTER — Encounter: Payer: Self-pay | Admitting: Vascular Surgery

## 2016-05-02 NOTE — Progress Notes (Signed)
Chief Complaint: Follow up Extracranial Carotid Artery Stenosis   History of Present Illness  Ricky Sherman is a 65 y.o. male patient of Dr. Imogene Burn who is s/p R CEA in 03/06/15 for asx ICA stenosis >80%.   He returns today for routine surveillance.  He had a stroke in 2002 as manifested by right hemiparesis and speech difficulties, denies amaurosis fugax. He has residual weakness in his right leg which limits his walking. He denies any subsequent stroke or TIA. He is right hand dominant.   His primary concerns today are low back pain and deteriorating vision from glaucoma. He has had a spine evaluation by a surgeon in [place] and states he was told that surgery may not improve his pain.   He has pins and needles feeling in his right foot at times. He denies non healing wounds in his feet/legs.   Pt Diabetic: yes, A1C was 5.8 on 02/28/15 (review of records) Pt smoker: former smoker, quit in 2016, smoked x 15 years  Pt meds include: Statin : yes ASA: yes Other anticoagulants/antiplatelets: no   Past Medical History:  Diagnosis Date  . Carotid artery occlusion   . Cataract    surgery L eye; currently in R eye  . Complication of anesthesia    pt states he woke up during colonoscopy  . DDD (degenerative disc disease), cervical   . DDD (degenerative disc disease), lumbar   . Diabetes mellitus without complication (HCC)    Type 2  . GERD (gastroesophageal reflux disease)   . Glaucoma    BIL  . Headache   . Hypertension   . Legally blind    BIL  . Nocturia   . Peripheral vascular disease of lower extremity (HCC)    Right Leg  . Seizure disorder [facility]) Jan and December 29, 2014  . Seizures [facility])    June 16th Last seizure, Also in January  . Shortness of breath dyspnea    frequently; with exertion; PCP is aware; reason for cardiology clearance prior to surgery  . Stroke [facility])    right sided weakness.2002  . Urinary urgency   . Weakness     Social History Social History    Substance Use Topics  . Smoking status: Former Smoker    Packs/day: 0.50    Years: 15.00    Types: Cigarettes    Quit date: 03/06/2015  . Smokeless tobacco: Never Used  . Alcohol use No    Family History Family History  Problem Relation Age of Onset  . Cancer Mother   . Heart attack Mother   . Diabetes Sister     Surgical History Past Surgical History:  Procedure Laterality Date  . COLONOSCOPY    . ENDARTERECTOMY Right 03/06/2015   Procedure: RIGHT CAROTID ENDARTERECTOMY;  Surgeon: Fransisco Hertz, MD;  Location: [facility] OR;  Service: Vascular;  Laterality: Right;  . EYE SURGERY     left eye; cataract and glaucoma  . PATCH ANGIOPLASTY Right 03/06/2015   Procedure: PATCH ANGIOPLASTY USING XENOSURE BIOLOGIC  PATCH 1cm x 6cm;  Surgeon: Fransisco Hertz, MD;  Location: [facility] OR;  Service: Vascular;  Laterality: Right;    No Known Allergies  Current Outpatient Prescriptions  Medication Sig Dispense Refill  . amLODipine (NORVASC) 5 MG tablet Take 5 mg by mouth daily.    Marland Kitchen aspirin EC 325 MG tablet Take 325 mg by mouth every morning.    . levETIRAcetam (KEPPRA) 500 MG tablet Take 1 tablet (500 mg  total) by mouth 2 (two) times daily. 60 tablet 0  . lovastatin (MEVACOR) 10 MG tablet Take 1 tablet (10 mg total) by mouth at bedtime. 90 tablet 11  . metoprolol (LOPRESSOR) 100 MG tablet Take 100 mg by mouth 2 (two) times daily.    . Multiple Vitamin (MULTIVITAMIN) tablet Take 1 tablet by mouth daily.    . naproxen (NAPROSYN) 500 MG tablet Take 500 mg by mouth 2 (two) times daily as needed for moderate pain.     . Omega-3 Fatty Acids (FISH OIL) 1000 MG CAPS Take 1,000 mg by mouth 2 (two) times daily.    Marland Kitchen. omeprazole (PRILOSEC) 20 MG capsule Take 20 mg by mouth as needed.    Marland Kitchen. oxyCODONE-acetaminophen (PERCOCET/ROXICET) 5-325 MG per tablet Take 1 tablet by mouth every 6 (six) hours as needed for severe pain. 20 tablet 0  . Vitamin D, Cholecalciferol, 400 UNITS CAPS Take 400 Units by mouth daily.    .  metFORMIN (GLUCOPHAGE) 500 MG tablet Take 500 mg by mouth 2 (two) times daily with a meal.     No current facility-administered medications for this visit.     Review of Systems : See HPI for pertinent positives and negatives.  Physical Examination  Vitals:   05/03/16 0923 05/03/16 0926 05/03/16 0927 05/03/16 0929  BP: (!) 160/90 (!) 157/93 (!) 155/95 (!) 158/85  Pulse: 65     Resp: 18     Temp: 97.8 F (36.6 C)     TempSrc: Oral     SpO2: 97%     Weight: 156 lb 8 oz (71 kg)     Height: 5\' 7"  (1.702 m)      Body mass index is 24.51 kg/m.  General: WDWN male in NAD GAIT: normal Eyes: Right pupil is round, moderately clouded by cataract, does constrict to light. Left pupil is irregularly shaped, smaller than the right pupil, and does not constrict to light. Pulmonary:  Respirations are non-labored, good air movement, CTAB  Cardiac: regular rhythm and rate, no detected murmur.  VASCULAR EXAM Carotid Bruits Right Left   Negative Negative    Aorta is not palpable. Radial pulses are 2+ palpable and equal.                                                                                                                      Round,       LE Pulses Right Left       POPLITEAL  not palpable   not palpable       POSTERIOR TIBIAL  not palpable   not palpable        DORSALIS PEDIS      ANTERIOR TIBIAL not palpable  not palpable     Gastrointestinal: soft, nontender, BS WNL, no r/g, no palpable masses.  Musculoskeletal: No muscle atrophy/wasting. M/S 5/5 in upper extremities, 4/5 in lower extremities, extremities without ischemic changes. + moderate kyphosis.   Neurologic: A&O X 3; Appropriate Affect, Speech is normal  CN 2-12 intact, pain and light touch intact in extremities, Motor exam as listed above.    Assessment: Ricky Sherman is a 65 y.o. male who is s/p R CEA in 03/06/15 for asx ICA stenosis >80%.   He had a stroke in 2002 with residual right leg weakness, no subsequent  stroke or TIA.   His atherosclerotic risk factors include former smoker x 15 years and well controlled DM. He takes a daily statin and ASA. He has a normal BMI.   He is scheduled to see his PCP on 05/22/16; his blood pressure is elevated at this time and can be addressed at that time if it remains elevated.    DATA Today's carotid duplex suggests right CEA site with no restenosis. Proximal left internal carotid artery with <40% stenosis. Bilateral vertebral arteries are antegrade. Bilateral subclavian arteries are triphasic (normal).  No previous post operative duplex for comparison.    Plan: Follow-up in 1 year with Carotid Duplex scan.  I discussed in depth with the patient the nature of atherosclerosis, and emphasized the importance of maximal medical management including strict control of blood pressure, blood glucose, and lipid levels, obtaining regular exercise, and continued cessation of smoking.  The patient is aware that without maximal medical management the underlying atherosclerotic disease process will progress, limiting the benefit of any interventions. The patient was given information about stroke prevention and what symptoms should prompt the patient to seek immediate medical care. Thank you for allowing [place] to participate in this patient's care.  Charisse March, RN, MSN, FNP-C [facility] [place] Office: [phone]  Clinic Physician: Imogene Burn  05/03/16 9:31 AM

## 2016-05-03 ENCOUNTER — Encounter: Payer: Self-pay | Admitting: Family

## 2016-05-03 ENCOUNTER — Ambulatory Visit (HOSPITAL_COMMUNITY)
Admission: RE | Admit: 2016-05-03 | Discharge: 2016-05-03 | Disposition: A | Discharge status: Home / Self Care | Payer: Medicare Other | Source: Ambulatory Visit | Attending: Vascular Surgery | Admitting: Vascular Surgery

## 2016-05-03 ENCOUNTER — Ambulatory Visit (INDEPENDENT_AMBULATORY_CARE_PROVIDER_SITE_OTHER): Payer: Medicare Other | Admitting: Family

## 2016-05-03 VITALS — BP 158/85 | HR 65 | Temp 97.8°F | Resp 18 | Ht 67.0 in | Wt 156.5 lb

## 2016-05-03 DIAGNOSIS — I6523 Occlusion and stenosis of bilateral carotid arteries: Secondary | ICD-10-CM

## 2016-05-03 DIAGNOSIS — Z87891 Personal history of nicotine dependence: Secondary | ICD-10-CM

## 2016-05-03 DIAGNOSIS — Z9889 Other specified postprocedural states: Secondary | ICD-10-CM

## 2016-05-03 LAB — VAS US CAROTID
LCCADDIAS: 20 cm/s
LCCADSYS: 76 cm/s
LEFT ECA DIAS: -52 cm/s
LICADSYS: -74 cm/s
LICAPDIAS: 36 cm/s
LICAPSYS: 130 cm/s
Left CCA prox dias: 20 cm/s
Left CCA prox sys: 79 cm/s
Left ICA dist dias: -28 cm/s
RCCAPSYS: 79 cm/s
RIGHT CCA MID DIAS: 13 cm/s
RIGHT ECA DIAS: -19 cm/s
RIGHT VERTEBRAL DIAS: 9 cm/s
Right CCA prox dias: 16 cm/s
Right cca dist sys: -78 cm/s

## 2016-05-03 NOTE — Patient Instructions (Signed)
Stroke Prevention Some medical conditions and behaviors are associated with an increased chance of having a stroke. You may prevent a stroke by making healthy choices and managing medical conditions. HOW CAN I REDUCE MY RISK OF HAVING A STROKE?   Stay physically active. Get at least 30 minutes of activity on most or all days.  Do not smoke. It may also be helpful to avoid exposure to secondhand smoke.  Limit alcohol use. Moderate alcohol use is considered to be:  No more than 2 drinks per day for men.  No more than 1 drink per day for nonpregnant women.  Eat healthy foods. This involves:  Eating 5 or more servings of fruits and vegetables a day.  Making dietary changes that address high blood pressure (hypertension), high cholesterol, diabetes, or obesity.  Manage your cholesterol levels.  Making food choices that are high in fiber and low in saturated fat, trans fat, and cholesterol may control cholesterol levels.  Take any prescribed medicines to control cholesterol as directed by your health care provider.  Manage your diabetes.  Controlling your carbohydrate and sugar intake is recommended to manage diabetes.  Take any prescribed medicines to control diabetes as directed by your health care provider.  Control your hypertension.  Making food choices that are low in salt (sodium), saturated fat, trans fat, and cholesterol is recommended to manage hypertension.  Ask your health care provider if you need treatment to lower your blood pressure. Take any prescribed medicines to control hypertension as directed by your health care provider.  If you are 18-39 years of age, have your blood pressure checked every 3-5 years. If you are 40 years of age or older, have your blood pressure checked every year.  Maintain a healthy weight.  Reducing calorie intake and making food choices that are low in sodium, saturated fat, trans fat, and cholesterol are recommended to manage  weight.  Stop drug abuse.  Avoid taking birth control pills.  Talk to your health care provider about the risks of taking birth control pills if you are over 35 years old, smoke, get migraines, or have ever had a blood clot.  Get evaluated for sleep disorders (sleep apnea).  Talk to your health care provider about getting a sleep evaluation if you snore a lot or have excessive sleepiness.  Take medicines only as directed by your health care provider.  For some people, aspirin or blood thinners (anticoagulants) are helpful in reducing the risk of forming abnormal blood clots that can lead to stroke. If you have the irregular heart rhythm of atrial fibrillation, you should be on a blood thinner unless there is a good reason you cannot take them.  Understand all your medicine instructions.  Make sure that other conditions (such as anemia or atherosclerosis) are addressed. SEEK IMMEDIATE MEDICAL CARE IF:   You have sudden weakness or numbness of the face, arm, or leg, especially on one side of the body.  Your face or eyelid droops to one side.  You have sudden confusion.  You have trouble speaking (aphasia) or understanding.  You have sudden trouble seeing in one or both eyes.  You have sudden trouble walking.  You have dizziness.  You have a loss of balance or coordination.  You have a sudden, severe headache with no known cause.  You have new chest pain or an irregular heartbeat. Any of these symptoms may represent a serious problem that is an emergency. Do not wait to see if the symptoms will   go away. Get medical help at once. Call your local emergency services (911 in [place] Do not drive yourself to the hospital.   This information is not intended to replace advice given to you by your health care provider. Make sure you discuss any questions you have with your health care provider.   Document Released: 08/08/2004 Document Revised: 07/22/2014 Document Reviewed:  01/01/2013 Elsevier Interactive Patient Education 2016 [employer] Inc.  

## 2016-05-28 NOTE — Addendum Note (Signed)
Addended by: Sharee PimpleMCCHESNEY, Makayla Confer K on: 05/28/2016 04:21 PM   Modules accepted: Orders

## 2016-10-24 ENCOUNTER — Encounter: Payer: Self-pay | Admitting: Cardiology

## 2017-01-10 ENCOUNTER — Other Ambulatory Visit: Payer: Self-pay | Admitting: Vascular Surgery

## 2017-01-10 DIAGNOSIS — I7771 Dissection of carotid artery: Secondary | ICD-10-CM

## 2017-01-10 MED ORDER — LOVASTATIN 10 MG PO TABS: 10.0000 mg | ORAL_TABLET | Freq: Every day | ORAL | 3 refills | Status: AC

## 2017-05-16 ENCOUNTER — Encounter (HOSPITAL_COMMUNITY): Payer: Medicare Other

## 2017-05-16 ENCOUNTER — Ambulatory Visit: Payer: Medicare Other | Admitting: Family

## 2017-07-25 ENCOUNTER — Encounter (HOSPITAL_COMMUNITY): Payer: Medicare Other

## 2017-07-25 ENCOUNTER — Ambulatory Visit: Payer: Medicare Other | Admitting: Family

## 2017-09-17 ENCOUNTER — Ambulatory Visit (HOSPITAL_COMMUNITY)
Admission: RE | Admit: 2017-09-17 | Discharge: 2017-09-17 | Disposition: A | Discharge status: Home / Self Care | Payer: Medicare Other | Source: Ambulatory Visit | Attending: Family Medicine | Admitting: Family Medicine

## 2017-09-17 ENCOUNTER — Other Ambulatory Visit (HOSPITAL_COMMUNITY): Payer: Self-pay | Admitting: Family Medicine

## 2017-09-17 DIAGNOSIS — G8929 Other chronic pain: Secondary | ICD-10-CM

## 2017-09-17 DIAGNOSIS — M8938 Hypertrophy of bone, other site: Secondary | ICD-10-CM | POA: Insufficient documentation

## 2017-09-17 DIAGNOSIS — M47816 Spondylosis without myelopathy or radiculopathy, lumbar region: Secondary | ICD-10-CM | POA: Insufficient documentation

## 2017-09-17 DIAGNOSIS — M5136 Other intervertebral disc degeneration, lumbar region: Secondary | ICD-10-CM | POA: Diagnosis not present

## 2024-06-02 ENCOUNTER — Ambulatory Visit: Admitting: Nurse Practitioner

## 2024-06-02 NOTE — Progress Notes (Deleted)
 There were no vitals taken for this visit.   Subjective:    Patient ID: Ricky Sherman, male    DOB: 1951-01-10, 73 y.o.   MRN: [id number]  HPI: Ricky Sherman is a 73 y.o. male  No chief complaint on file.  Patient presents to clinic to establish care with new PCP.  Introduced to publishing rights manager role and practice setting.  All questions answered.  Discussed provider/patient relationship and expectations.  Patient reports a history of ***. Patient denies a history of: Hypertension, Elevated Cholesterol, Diabetes, Thyroid problems, Depression, Anxiety, Neurological problems, and Abdominal problems.   Active Ambulatory Problems    Diagnosis Date Noted   Seizure disorder (HCC) 01/27/2015   Cerebral infarction (HCC) 01/27/2015   Carotid artery disease (HCC) 01/27/2015   Resolved Ambulatory Problems    Diagnosis Date Noted   Asymptomatic carotid artery stenosis 03/06/2015   Past Medical History:  Diagnosis Date   Carotid artery occlusion    Cataract    Complication of anesthesia    DDD (degenerative disc disease), cervical    DDD (degenerative disc disease), lumbar    Diabetes mellitus without complication (HCC)    GERD (gastroesophageal reflux disease)    Glaucoma    Headache    Hypertension    Legally blind    Nocturia    Peripheral vascular disease of lower extremity    Seizures (HCC)    Shortness of breath dyspnea    Stroke [facility])    Urinary urgency    Weakness    Past Surgical History:  Procedure Laterality Date   COLONOSCOPY     ENDARTERECTOMY Right 03/06/2015   Procedure: RIGHT CAROTID ENDARTERECTOMY;  Surgeon: Redell LITTIE Door, MD;  Location: [facility] OR;  Service: Vascular;  Laterality: Right;   EYE SURGERY     left eye; cataract and glaucoma   PATCH ANGIOPLASTY Right 03/06/2015   Procedure: PATCH ANGIOPLASTY USING XENOSURE BIOLOGIC  PATCH 1cm x 6cm;  Surgeon: Redell LITTIE Door, MD;  Location: [facility] OR;  Service: Vascular;  Laterality: Right;   Family History  Problem Relation Age  of Onset   Cancer Mother    Heart attack Mother    Diabetes Sister      Review of Systems  Per HPI unless specifically indicated above     Objective:    There were no vitals taken for this visit.  Wt Readings from Last 3 Encounters:  05/03/16 156 lb 8 oz (71 kg)  03/28/15 149 lb 14.4 oz (68 kg)  03/06/15 146 lb (66.2 kg)    Physical Exam  Results for orders placed or performed during the hospital encounter of 05/03/16  VAS US  CAROTID   Collection Time: 05/03/16  9:20 AM  Result Value Ref Range   Right CCA prox sys 79 cm/s   Right CCA prox dias 16 cm/s   Right cca dist sys -78 cm/s   Left CCA prox sys 79 cm/s   Left CCA prox dias 20 cm/s   Left CCA dist sys 76 cm/s   Left CCA dist dias 20 cm/s   Left ICA prox sys 130 cm/s   Left ICA prox dias 36 cm/s   Left ICA dist sys -74 cm/s   Left ICA dist dias -28 cm/s   RIGHT CCA MID DIAS 13.00 cm/s   RIGHT ECA DIAS -19.00 cm/s   RIGHT VERTEBRAL DIAS 9.00 cm/s   LEFT ECA DIAS -52.00 cm/s      Assessment & Plan:   Problem List Items  Addressed This Visit       Cardiovascular and Mediastinum   Carotid artery disease (HCC)     Nervous and Auditory   Seizure disorder (HCC) - Primary     Follow up plan: No follow-ups on file.
